# Patient Record
Sex: Female | Born: 2000 | Race: Black or African American | Hispanic: No | Marital: Single | Smoking: Current every day smoker
Health system: Southern US, Community
[De-identification: ages and names within clinical notes are randomized; demographics above are authoritative.]

## PROBLEM LIST (undated history)

## (undated) DIAGNOSIS — A6000 Herpesviral infection of urogenital system, unspecified: Secondary | ICD-10-CM

---
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
## 2018-01-05 ENCOUNTER — Other Ambulatory Visit: Payer: Self-pay

## 2018-01-05 ENCOUNTER — Ambulatory Visit (INDEPENDENT_AMBULATORY_CARE_PROVIDER_SITE_OTHER): Payer: Medicaid Other | Admitting: Family Medicine

## 2018-01-05 ENCOUNTER — Encounter: Payer: Self-pay | Admitting: Family Medicine

## 2018-01-05 VITALS — BP 120/60 | HR 104 | Temp 98.5°F | Ht 66.0 in | Wt 209.0 lb

## 2018-01-05 DIAGNOSIS — L989 Disorder of the skin and subcutaneous tissue, unspecified: Secondary | ICD-10-CM

## 2018-01-05 DIAGNOSIS — L700 Acne vulgaris: Secondary | ICD-10-CM | POA: Diagnosis not present

## 2018-01-05 MED ORDER — CLINDAMYCIN PHOS-BENZOYL PEROX 1-5 % EX GEL: Freq: Two times a day (BID) | CUTANEOUS | 0 refills | Status: DC

## 2018-01-05 NOTE — Progress Notes (Signed)
  Subjective:     Patient ID: Leah [name], female   DOB: December 29, 2000, 17 y.o.   MRN: [id number]  HPI Acne:Facial acne usually around her periods and then it gets better. In the last 3 months, it has been persistent, bigger and painful. She will like to get this check. Denies change in diet, medication or lotion. Scalp bump: For 3 years, now getting bigger and can get painful when irritated.  Current Outpatient Medications on File Prior to Visit  Medication Sig Dispense Refill  . metroNIDAZOLE (METROGEL) 0.75 % vaginal gel Place 1 Applicatorful vaginally at bedtime. Apply one applicatorful to vagina at bedtime for 5 days 70 g 1  . Norethindrone Acetate-Ethinyl Estrad-FE (LOESTRIN 24 FE) 1-20 MG-MCG(24) tablet Take 1 tablet by mouth daily. 1 Package 11  . sulfamethoxazole-trimethoprim (BACTRIM DS) 800-160 MG tablet Take 1 tablet by mouth 2 (two) times daily. 6 tablet 0   No current facility-administered medications on file prior to visit.    History reviewed. No pertinent past medical history. Vitals:   01/05/18 1504  BP: 120/60  Pulse: (!) 104  Temp: 98.5 F (36.9 C)  TempSrc: Oral  SpO2: 95%  Weight: 209 lb (94.8 kg)  Height: 5\' 6"  (1.676 m)     Review of Systems  Respiratory: Negative.   Cardiovascular: Negative.   Gastrointestinal: Negative.   Genitourinary: Negative.   Skin: Positive for rash.       Bump  Neurological: Negative.   All other systems reviewed and are negative.      Objective:   Physical Exam  Constitutional: She is oriented to person, place, and time. She appears well-developed. No distress.  HENT:  Head: Normocephalic.    Eyes: Pupils are equal, round, and reactive to light.  Pulmonary/Chest: Effort normal.  Neurological: She is alert and oriented to person, place, and time.  Skin: Skin is intact. Rash noted. Rash is maculopapular and nodular. No erythema.  Nursing note and vitals reviewed. [facility] exam completed today.          Assessment:     Moderate acne with mild inflammation. Scalp nodular lesion    Plan:     Trial of Benzacline for her acne. F/U in 2 weeks for reassessment.  Scalp nodule may be cyst. Return to Derm clinic for removal. We should be able to cauterize it.  F/U with PCP for other health concern and routine health maintenance.

## 2018-01-05 NOTE — Patient Instructions (Signed)
Acne  Acne is a skin problem that causes small, red bumps (pimples). Acne happens when the tiny holes in your skin (pores) get blocked. Your pores may become red, sore, and swollen. They may also become infected. Acne is a common skin problem. It is especially common in teenagers. Acne usually goes away over time.  Follow these instructions at home:  Good skin care is the most important thing you can do to treat your acne. Take care of your skin as told by your doctor. You may be told to do these things:  · Wash your skin gently at least two times each day. You should also wash your skin:  ? After you exercise.  ? Before you go to bed.  · Use mild soap.  · Use a water-based skin moisturizer after you wash your skin.  · Use a sunscreen or sunblock with SPF 30 or greater. This is very important if you are using acne medicines.  · Choose cosmetics that will not plug your oil glands (are noncomedogenic).    Medicines  · Take over-the-counter and prescription medicines only as told by your doctor.  · If you were prescribed an antibiotic medicine, apply or take it as told by your doctor. Do not stop using the antibiotic even if your acne improves.  General instructions  · Keep your hair clean and off of your face. Shampoo your hair regularly. If you have oily hair, you may need to wash it every day.  · Avoid leaning your chin or forehead on your hands.  · Avoid wearing tight headbands or hats.  · Avoid picking or squeezing your pimples. That can make your acne worse and cause scarring.  · Keep all follow-up visits as told by your doctor. This is important.  · Shave gently. Only shave when it is necessary.  · Keep a food journal. This can help you to see if any foods are linked with your acne.  Contact a doctor if:  · Your acne is not better after eight weeks.  · Your acne gets worse.  · You have a large area of skin that is red or tender.  · You think that you are having side effects from any acne medicine.  This  information is not intended to replace advice given to you by your health care provider. Make sure you discuss any questions you have with your health care provider.  Document Released: 01/16/2011 Document Revised: 07/05/2015 Document Reviewed: 04/05/2014  Elsevier Interactive Patient Education © 2018 Elsevier Inc.

## 2018-01-21 ENCOUNTER — Ambulatory Visit: Payer: Medicaid Other

## 2018-01-27 ENCOUNTER — Other Ambulatory Visit: Payer: Self-pay | Admitting: Family Medicine

## 2018-02-18 ENCOUNTER — Ambulatory Visit (INDEPENDENT_AMBULATORY_CARE_PROVIDER_SITE_OTHER): Payer: Medicaid Other | Admitting: Family Medicine

## 2018-02-18 VITALS — BP 120/72 | HR 90 | Temp 98.4°F | Ht 66.0 in | Wt 221.6 lb

## 2018-02-18 DIAGNOSIS — L700 Acne vulgaris: Secondary | ICD-10-CM | POA: Diagnosis not present

## 2018-02-18 DIAGNOSIS — D229 Melanocytic nevi, unspecified: Secondary | ICD-10-CM | POA: Diagnosis not present

## 2018-02-18 MED ORDER — CLINDAMYCIN PHOS-BENZOYL PEROX 1-5 % EX GEL: Freq: Two times a day (BID) | CUTANEOUS | 2 refills | Status: DC

## 2018-02-18 NOTE — Progress Notes (Signed)
    Subjective:    Patient ID: Leah [name], female    DOB: Jun 15, 2000, 18 y.o.   MRN: [id number]   CC: bump on scalp, acne  HPI:  Bump on scalp- reports this has been there for several years. It is bothersome to her because it is in a visible area. She feels it may have gotten bigger over the years. It does not bleed, itch, or hurt.   Acne- reports this is new since the fall (September-November). She did not have acne in her teens/during puberty. She would occasionally have acne prior to menstrual cycle. She reports the acne now is worse on her forehead and cheeks. It is at times very red and tender to touch. She denies new hair products, no lotions/make ups, new detergents, new irritants. She has tried topical clinda/BP gel with improvement but it has not gone away completely. She was told to start OCPs by her OB/gyn but did not start them as she is not sexually active and did not want to take the pills. She also has some lesions on her upper back. She reports her mother had similar issues with acne in her 73's and used proactive with improvement.   Smoking status reviewed- current smoker- e-cigs  Review of Systems- see HPI   Objective:  BP 120/72   Pulse 90   Temp 98.4 F (36.9 C) (Oral)   Ht 5\' 6"  (1.676 m)   Wt 221 lb 9.6 oz (100.5 kg)   SpO2 98%   BMI 35.77 kg/m  Vitals and nursing note reviewed  General: well nourished, in no acute distress HEENT: normocephalic, MMM Cardiac: regular rate Respiratory: no increased work of breathing Skin: warm and dry. Cystic pustular acne over cheeks and forehead. Small flesh colored nodule in scalp.  Neuro: alert and oriented, no focal deficits   Assessment & Plan:    Cystic acne vulgaris  Unclear why acne came on late in life. May be a hormonal component. Refilled clinda/BP gel. If this does not control acne could try topical retinoid. Patient has rx for OCPs from OB/gyn which she could try to see if this improves acne from a  hormone standpoint. Follow up as needed   Benign skin mole  Advised this appears to be a benign mole in her scalp, we could remove it but this would require shaving an area to access the mole. She does not want to have this done today as she states she is used to the mole being there now. Reassurance provided. Follow up as needed     Return as needed.   Lucila Maine, DO Family Medicine Resident PGY-3

## 2018-02-18 NOTE — Assessment & Plan Note (Signed)
  Unclear why acne came on late in life. May be a hormonal component. Refilled clinda/BP gel. If this does not control acne could try topical retinoid. Patient has rx for OCPs from OB/gyn which she could try to see if this improves acne from a hormone standpoint. Follow up as needed

## 2018-02-18 NOTE — Patient Instructions (Signed)
  Nice to see you! Please continue to use the clinda-benzoyl peroxide gel twice a day. Return if this does not control the acne.  If you feel the bump on your scalp is getting bigger or changing please return for removal.  If you have questions or concerns please do not hesitate to call at [phone].  Lucila Maine, DO PGY-3, [facility] 02/18/2018 3:13 PM

## 2018-02-18 NOTE — Assessment & Plan Note (Signed)
  Advised this appears to be a benign mole in her scalp, we could remove it but this would require shaving an area to access the mole. She does not want to have this done today as she states she is used to the mole being there now. Reassurance provided. Follow up as needed

## 2018-02-19 DIAGNOSIS — R300 Dysuria: Secondary | ICD-10-CM | POA: Diagnosis not present

## 2018-02-19 DIAGNOSIS — R350 Frequency of micturition: Secondary | ICD-10-CM | POA: Diagnosis not present

## 2018-02-19 DIAGNOSIS — N898 Other specified noninflammatory disorders of vagina: Secondary | ICD-10-CM | POA: Diagnosis not present

## 2018-02-26 DIAGNOSIS — A749 Chlamydial infection, unspecified: Secondary | ICD-10-CM | POA: Diagnosis not present

## 2018-03-03 ENCOUNTER — Ambulatory Visit (INDEPENDENT_AMBULATORY_CARE_PROVIDER_SITE_OTHER): Payer: Medicaid Other | Admitting: *Deleted

## 2018-03-03 ENCOUNTER — Other Ambulatory Visit (HOSPITAL_COMMUNITY)
Admission: RE | Admit: 2018-03-03 | Discharge: 2018-03-03 | Disposition: A | Discharge status: Home / Self Care | Payer: Medicaid Other | Source: Ambulatory Visit | Attending: Obstetrics and Gynecology | Admitting: Obstetrics and Gynecology

## 2018-03-03 VITALS — BP 124/79 | HR 111 | Temp 98.3°F | Ht 66.0 in | Wt 210.4 lb

## 2018-03-03 DIAGNOSIS — Z202 Contact with and (suspected) exposure to infections with a predominantly sexual mode of transmission: Secondary | ICD-10-CM | POA: Insufficient documentation

## 2018-03-03 DIAGNOSIS — N898 Other specified noninflammatory disorders of vagina: Secondary | ICD-10-CM

## 2018-03-03 NOTE — Progress Notes (Signed)
    SUBJECTIVE:  18 y.o. female complains of clear to white, odorless and thick vaginal discharge for 6 day(s). Denies abnormal vaginal bleeding or significant pelvic pain or fever. No UTI symptoms. Recent exposure to chlamydia. Patient reported testing on 02/19/2018 and positive chlamydia result. Patient had sex on 02/19/2018 after testing. Medication was taken on 02/26/2018. Patient is unsure if partner was tested/treated for infection.  Last LMP 01/31/2018.  OBJECTIVE:  She appears well, afebrile. Urine dipstick: not done.  ASSESSMENT:  Vaginal Discharge    PLAN:  GC, chlamydia, trichomonas, BVAG, CVAG probe sent to lab. Treatment: To be determined once lab results are received ROV prn if symptoms persist or worsen.   Derl Barrow, RN

## 2018-03-04 LAB — CERVICOVAGINAL ANCILLARY ONLY
Bacterial vaginitis: NEGATIVE
CHLAMYDIA, DNA PROBE: POSITIVE — AB
Candida vaginitis: NEGATIVE
Neisseria Gonorrhea: NEGATIVE
TRICH (WINDOWPATH): NEGATIVE

## 2018-03-05 ENCOUNTER — Telehealth: Payer: Self-pay | Admitting: *Deleted

## 2018-03-05 NOTE — Telephone Encounter (Signed)
-----   Message from Laury Deep, [place] sent at 03/05/2018  8:21 AM EST ----- Treat for (+) CT

## 2018-03-05 NOTE — Telephone Encounter (Signed)
Left voice message for patient to return nurse call regarding test results.  Alexsandria Kivett L, RN  

## 2018-03-05 NOTE — Telephone Encounter (Signed)
Patient returned nurse call regarding test results.  Patient verified DOB. Patient informed that result positive for chlamydia and need for treatment. Patient will come into clinic on Monday 03/08/2018 for treatment and receive Rx for partner treatment.  Derl Barrow, RN

## 2018-03-08 NOTE — Telephone Encounter (Signed)
-----   Message from Laury Deep, [place] sent at 03/05/2018  8:21 AM EST ----- Treat for (+) CT

## 2018-03-11 ENCOUNTER — Ambulatory Visit (INDEPENDENT_AMBULATORY_CARE_PROVIDER_SITE_OTHER): Payer: Medicaid Other | Admitting: *Deleted

## 2018-03-11 VITALS — BP 112/78 | HR 99 | Ht 66.0 in | Wt 206.6 lb

## 2018-03-11 DIAGNOSIS — A749 Chlamydial infection, unspecified: Secondary | ICD-10-CM

## 2018-03-11 MED ORDER — AZITHROMYCIN 500 MG PO TABS
1000.0000 mg | ORAL_TABLET | Freq: Once | ORAL | Status: AC
  Administered 2018-03-11: 1000 mg via ORAL

## 2018-03-11 NOTE — Progress Notes (Signed)
   S: Patient seen in clinic 03/02/2018 for possible exposure to std. O: Need treatment for Chlamydia. A: + Chlamydia P: Azithromycin 1 gm PO x 1 given. No sex with partner until treated. Rx for partner therapy given. Communicable disease report form faxed to [facility]. HD. Pt to be seen in month for TOC.  Derl Barrow, RN

## 2018-03-12 ENCOUNTER — Encounter: Payer: Self-pay | Admitting: General Practice

## 2018-03-16 ENCOUNTER — Other Ambulatory Visit: Payer: Self-pay | Admitting: *Deleted

## 2018-03-16 DIAGNOSIS — A749 Chlamydial infection, unspecified: Secondary | ICD-10-CM

## 2018-03-16 MED ORDER — AZITHROMYCIN 500 MG PO TABS: 1000.0000 mg | ORAL_TABLET | Freq: Every day | ORAL | 0 refills | Status: AC

## 2018-03-16 NOTE — Progress Notes (Signed)
Patient sent MyChart message stating she was not able to keep medication down at last visit. Medication sent to pharmacy.  Derl Barrow, RN

## 2018-03-19 ENCOUNTER — Ambulatory Visit: Payer: Medicaid Other | Admitting: Family Medicine

## 2018-03-22 ENCOUNTER — Ambulatory Visit: Payer: Medicaid Other | Admitting: Family Medicine

## 2018-03-23 ENCOUNTER — Ambulatory Visit (INDEPENDENT_AMBULATORY_CARE_PROVIDER_SITE_OTHER): Payer: Medicaid Other | Admitting: Family Medicine

## 2018-03-23 ENCOUNTER — Other Ambulatory Visit: Payer: Self-pay

## 2018-03-23 ENCOUNTER — Other Ambulatory Visit (HOSPITAL_COMMUNITY)
Admission: RE | Admit: 2018-03-23 | Discharge: 2018-03-23 | Disposition: A | Discharge status: Home / Self Care | Payer: Medicaid Other | Source: Ambulatory Visit | Attending: Family Medicine | Admitting: Family Medicine

## 2018-03-23 VITALS — BP 102/60 | HR 89 | Temp 98.1°F | Wt 208.0 lb

## 2018-03-23 DIAGNOSIS — R109 Unspecified abdominal pain: Secondary | ICD-10-CM

## 2018-03-23 LAB — POCT UA - MICROSCOPIC ONLY: RBC, urine, microscopic: >20

## 2018-03-23 LAB — POCT URINALYSIS DIP (MANUAL ENTRY)
BILIRUBIN UA: NEGATIVE
Glucose, UA: NEGATIVE mg/dL
Ketones, POC UA: NEGATIVE mg/dL
Leukocytes, UA: NEGATIVE
NITRITE UA: NEGATIVE
Protein Ur, POC: NEGATIVE mg/dL
Spec Grav, UA: >=1.03 — AB (ref 1.010–1.025)
Urobilinogen, UA: 0.2 E.U./dL
pH, UA: 6 (ref 5.0–8.0)

## 2018-03-23 LAB — POCT URINE PREGNANCY: Preg Test, Ur: NEGATIVE

## 2018-03-23 NOTE — Progress Notes (Signed)
   CC: Abdominal pain  HPI  Patient has her sister in the room with her and gives me permission to discuss all of these issues in front of her sister.  Abd pain - also felt like she had a cold last week, with some sore throat. abd pain for 1-2 weeks.  Off and on, last week was worse. Just off her period. Feels like bloating, aching, gassy, nauseous. Sometimes before she eats, sometimes after. Not on birth control. Sexually active men 1 partner. No sex since menses. She did pick up the azithromax and took it. 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 Went to UC 1/10, got what sounds like CTX and azithro on 1/17 when they called her back with the positive results. Still positive on 1/30.  Treated in clinic that day, but patient vomited up the medicine shortly thereafter.  The OBs office called and a repeat is a thorough prescription and the patient picked up the new azithro from pharmacy 2/4. Felt she was having more BMs that were loose after the last azithro dose. The frequent BMs seemed to be associated with the abdominal pain. No fevers. No more vaginal discharge. She did have sex between UC initial visit on 1/10 and her 1/17 treatment.  She thinks her partner was treated but is not sure.  She is not suspecting that her partner has multiple other partners.  ROS: Denies CP, SOB, abdominal pain, dysuria, changes in BMs.   CC, SH/smoking status, and VS noted  Objective: BP 102/60   Pulse 89   Temp 98.1 F (36.7 C) (Oral)   Wt 208 lb (94.3 kg)   LMP 03/15/2018   SpO2 98%   BMI 33.57 kg/m  Gen: NAD, alert, cooperative, and pleasant. HEENT: NCAT, EOMI, PERRL CV: RRR, no murmur Resp: CTAB, no wheezes, non-labored Abd: soft, tender to deep palpation periumbilically, BS present, no guarding or organomegaly GU: normal vulvar skin and mucosa, no CMT or adnexal tenderness Ext: No edema, warm Neuro: Alert and oriented, Speech clear, No  gross deficits  Assessment and plan:  Recent chlamydia infection: According to the CDC we can repeat testing 3 weeks from initial positive.  It is been 3-1/2 weeks since she was treated.  There is also a distinct possibility of reinfection as she is not sure if her partner was treated.  We will repeat that swab today.  Additionally, should this be a presentation of PID, this could certainly be contributing to her abdominal pain.  Abdominal pain: Most likely most benign would be antibiotic associated diarrhea and some pain associated with that.  Counseled the patient that she should try yogurt and probiotics for this.  However as above, we need to consider PID.  If the pain does not improve over the next week, the patient will let us know we will pursue further work-up.  Also considered appendicitis or other bowel pathology, however the pain is intermittent and not very severe.  Orders Placed This Encounter  Procedures  . POCT urinalysis dipstick  . POCT urine pregnancy  . POCT UA - Microscopic Only    No orders of the defined types were placed in this encounter.   Ralene Ok, MD, PGY3 03/25/2018 9:40 AM

## 2018-03-23 NOTE — Patient Instructions (Signed)
It was a pleasure to see you today! Thank you for choosing [facility] for your primary care. Leah [name] was seen for abdominal pain.   Our plans for today were:  Most likely you have some stomach irritation from the antibiotics, you can try tums as needed, even with every meal over the next few days. See if this helps.  We will send off the swab test again. If it is positive, we will need to treat you again.   If you have worsening abdominal pain, please call us.   Please make sure your partner was treated.    Best,  Dr. Lindell Noe

## 2018-03-24 LAB — CERVICOVAGINAL ANCILLARY ONLY
Chlamydia: POSITIVE — AB
Neisseria Gonorrhea: NEGATIVE

## 2018-03-25 ENCOUNTER — Encounter: Payer: Self-pay | Admitting: *Deleted

## 2018-03-25 ENCOUNTER — Encounter: Payer: Self-pay | Admitting: Family Medicine

## 2018-03-25 ENCOUNTER — Telehealth: Payer: Self-pay | Admitting: Family Medicine

## 2018-03-25 DIAGNOSIS — A749 Chlamydial infection, unspecified: Secondary | ICD-10-CM

## 2018-03-25 DIAGNOSIS — R1031 Right lower quadrant pain: Secondary | ICD-10-CM

## 2018-03-25 NOTE — Telephone Encounter (Signed)
Patient with recent positive chlamydia, which should be negative at this point.  She is coming in with abdominal pain 2 days ago, I am suspicious for PID.  Repeat chlamydia was positive, I called the patient and left a generic voicemail to return her call.  She needs a stat transvaginal ultrasound to assess for PID.  I let the nurse team know that this will need to be scheduled if the patient calls back.  I will also send her a my chart message to call the nurse line.  Additionally, if she feels badly and has continued pain when she returns our call, she should be seen ASAP.

## 2018-03-25 NOTE — Telephone Encounter (Signed)
Pt called back.  Informed of message from MD.   She expresses understanding and states that her pain is about the same, no worse. She will call back if pain gets worse.  I have scheduled her an appt for tomorrow (03/26/18 @ 10:15).  She request to be notified via mychart because she is in class. Done as requested. Hartlyn Reigel, Salome Spotted, CMA

## 2018-03-26 ENCOUNTER — Encounter: Payer: Self-pay | Admitting: Family Medicine

## 2018-03-26 ENCOUNTER — Ambulatory Visit (HOSPITAL_COMMUNITY)
Admission: RE | Admit: 2018-03-26 | Discharge: 2018-03-26 | Disposition: A | Discharge status: Home / Self Care | Payer: Medicaid Other | Source: Ambulatory Visit | Attending: Family Medicine | Admitting: Family Medicine

## 2018-03-26 ENCOUNTER — Telehealth: Payer: Self-pay | Admitting: Family Medicine

## 2018-03-26 ENCOUNTER — Ambulatory Visit (INDEPENDENT_AMBULATORY_CARE_PROVIDER_SITE_OTHER): Payer: Medicaid Other

## 2018-03-26 DIAGNOSIS — R1031 Right lower quadrant pain: Secondary | ICD-10-CM | POA: Diagnosis not present

## 2018-03-26 DIAGNOSIS — A749 Chlamydial infection, unspecified: Secondary | ICD-10-CM

## 2018-03-26 LAB — POCT WET PREP (WET MOUNT): Trichomonas Wet Prep HPF POC: ABSENT

## 2018-03-26 MED ORDER — DOXYCYCLINE HYCLATE 100 MG PO TABS: 100.0000 mg | ORAL_TABLET | Freq: Two times a day (BID) | ORAL | 0 refills | Status: AC

## 2018-03-26 MED ORDER — CEFTRIAXONE SODIUM 250 MG IJ SOLR
250.0000 mg | Freq: Once | INTRAMUSCULAR | Status: AC
  Administered 2018-03-26: 250 mg via INTRAMUSCULAR

## 2018-03-26 NOTE — Telephone Encounter (Signed)
Called patient again, she did not see MyChart message, informed her that we need to do CTX 250mg  IM today in nurse clinic,and we also need to do a self swab wet prep at that time to check for trich. If trich were positive, we need to do a third therapy as well. She will need doxy 100mg  BID x 14 days for PID treatment, which I will send now. She is heading to [employer] [facility] [place], I asked her to call us when she leaves to see about nurse clinic before lunch or after.

## 2018-03-26 NOTE — Progress Notes (Signed)
Pt presents in nurse clinic today for 250 IM Rocephin injection, per [place] orders. Injection given RUOQ, pt tolerated injection well. Pt also self swabbed today for additional STD testing for Trich.   Will route note to [place].

## 2018-03-30 ENCOUNTER — Telehealth: Payer: Self-pay | Admitting: Family Medicine

## 2018-03-30 MED ORDER — FLUCONAZOLE 150 MG PO TABS: 150.0000 mg | ORAL_TABLET | Freq: Once | ORAL | 0 refills | Status: AC

## 2018-03-30 NOTE — Telephone Encounter (Signed)
Patient message via MyChart she has concerns with yeast infection, will prescribe Diflucan x1.

## 2018-04-05 ENCOUNTER — Ambulatory Visit: Payer: Medicaid Other

## 2018-04-09 ENCOUNTER — Other Ambulatory Visit: Payer: Self-pay

## 2018-04-09 ENCOUNTER — Other Ambulatory Visit (HOSPITAL_COMMUNITY)
Admission: RE | Admit: 2018-04-09 | Discharge: 2018-04-09 | Disposition: A | Discharge status: Home / Self Care | Payer: Medicaid Other | Source: Ambulatory Visit | Attending: Family Medicine | Admitting: Family Medicine

## 2018-04-09 ENCOUNTER — Ambulatory Visit (INDEPENDENT_AMBULATORY_CARE_PROVIDER_SITE_OTHER): Payer: Medicaid Other | Admitting: Family Medicine

## 2018-04-09 VITALS — BP 118/70 | HR 84 | Temp 97.8°F | Ht 66.0 in | Wt 209.0 lb

## 2018-04-09 DIAGNOSIS — Z8619 Personal history of other infectious and parasitic diseases: Secondary | ICD-10-CM | POA: Diagnosis not present

## 2018-04-09 DIAGNOSIS — J020 Streptococcal pharyngitis: Secondary | ICD-10-CM | POA: Insufficient documentation

## 2018-04-09 DIAGNOSIS — J029 Acute pharyngitis, unspecified: Secondary | ICD-10-CM | POA: Diagnosis not present

## 2018-04-09 LAB — POCT RAPID STREP A (OFFICE): Rapid Strep A Screen: NEGATIVE

## 2018-04-09 NOTE — Patient Instructions (Signed)
Was great meeting you today!  I am glad that you completed treatment for your chlamydia and your partner was as well.  Your sore throat is likely viral upper respiratory infection.  Your strep throat swab was negative.  If the gonorrhea chlamydia swab come back positive I will call you, but given that your chlamydia was previously treated I do not think it looked back positive.  We do not typically do a test of cure for least 3 months after treatment.

## 2018-04-10 DIAGNOSIS — J029 Acute pharyngitis, unspecified: Secondary | ICD-10-CM | POA: Insufficient documentation

## 2018-04-10 DIAGNOSIS — Z8619 Personal history of other infectious and parasitic diseases: Secondary | ICD-10-CM | POA: Insufficient documentation

## 2018-04-10 NOTE — Assessment & Plan Note (Signed)
Explained that test of cure not typically done 2 weeks after treatment.  Given that she is not been sexually active since treatment, she opted to not get the vaginal GC chlamydia swab.

## 2018-04-10 NOTE — Progress Notes (Signed)
   HPI 18 year old female who presents for sore throat as well as to check on test of cure for chlamydia.  Patient seen on 03/25/2018 and was positive for chlamydia as well as Trichomonas.  Patient received Rocephin IM to 50 injection.  Patient states that her partner was also treated.  She is interested in being checked to make sure she is "cured".  Also has sore throat.  States this is been going on for couple days.  She is concerned that her chlamydia is now her throat.  She states that she has had been around a few people with "strep throat".  Her symptoms started as a sore throat, she has had a mild cough as well.  CC: Sore throat, test of cure for chlamydia   ROS:   Review of Systems See HPI for ROS.   CC, SH/smoking status, and VS noted  Objective: BP 118/70 (BP Location: Left Arm, Patient Position: Sitting, Cuff Size: Large)   Pulse 84   Temp 97.8 F (36.6 C) (Oral)   Ht 5\' 6"  (1.676 m)   Wt 209 lb (94.8 kg)   LMP 03/15/2018   SpO2 98%   BMI 33.73 kg/m  Gen: 18 year old African-American female, resting comfortably, no acute distress HEENT: No tonsillar exudates, no posterior pharyngeal erythema.  Cervical lymphadenopathy present. CV: RRR, no murmur Resp: CTAB, no wheezes, non-labored Neuro: Alert and oriented, Speech clear, No gross deficits   Assessment and plan:  Sore throat Strep swab performed which was negative.  Due to patient desire will swab for GC chlamydia and throat.  Explained that swab would not impact management, but patient adamant about desire.  Likely viral upper respiratory infection causing sore throat.  Supportive measures, will call patient if chlamydia swab positive  History of chlamydia Explained that test of cure not typically done 2 weeks after treatment.  Given that she is not been sexually active since treatment, she opted to not get the vaginal GC chlamydia swab.   Orders Placed This Encounter  Procedures  . Rapid Strep A    No orders  of the defined types were placed in this encounter.    Guadalupe Dawn MD PGY-2 Family Medicine Resident  04/10/2018 10:58 PM

## 2018-04-10 NOTE — Assessment & Plan Note (Addendum)
Strep swab performed which was negative.  Due to patient desire will swab for GC chlamydia and throat.  Explained that swab would not impact management, but patient adamant about desire.  Likely viral upper respiratory infection causing sore throat.  Supportive measures, will call patient if chlamydia swab positive

## 2018-04-13 LAB — CERVICOVAGINAL ANCILLARY ONLY
Chlamydia: NEGATIVE
Neisseria Gonorrhea: NEGATIVE
TRICH (WINDOWPATH): NEGATIVE

## 2018-04-22 DIAGNOSIS — R197 Diarrhea, unspecified: Secondary | ICD-10-CM | POA: Diagnosis not present

## 2018-04-22 DIAGNOSIS — K529 Noninfective gastroenteritis and colitis, unspecified: Secondary | ICD-10-CM | POA: Diagnosis not present

## 2018-04-22 DIAGNOSIS — R110 Nausea: Secondary | ICD-10-CM | POA: Diagnosis not present

## 2018-04-22 DIAGNOSIS — Z113 Encounter for screening for infections with a predominantly sexual mode of transmission: Secondary | ICD-10-CM | POA: Diagnosis not present

## 2018-04-22 DIAGNOSIS — N926 Irregular menstruation, unspecified: Secondary | ICD-10-CM | POA: Diagnosis not present

## 2018-05-18 DIAGNOSIS — Z113 Encounter for screening for infections with a predominantly sexual mode of transmission: Secondary | ICD-10-CM | POA: Diagnosis not present

## 2018-05-18 DIAGNOSIS — Z3009 Encounter for other general counseling and advice on contraception: Secondary | ICD-10-CM | POA: Diagnosis not present

## 2018-05-18 DIAGNOSIS — Z0389 Encounter for observation for other suspected diseases and conditions ruled out: Secondary | ICD-10-CM | POA: Diagnosis not present

## 2018-05-18 DIAGNOSIS — Z1388 Encounter for screening for disorder due to exposure to contaminants: Secondary | ICD-10-CM | POA: Diagnosis not present

## 2018-05-18 DIAGNOSIS — B373 Candidiasis of vulva and vagina: Secondary | ICD-10-CM | POA: Diagnosis not present

## 2018-06-17 ENCOUNTER — Other Ambulatory Visit: Payer: Self-pay

## 2018-06-17 ENCOUNTER — Emergency Department (HOSPITAL_COMMUNITY)
Admission: EM | Admit: 2018-06-17 | Discharge: 2018-06-17 | Disposition: A | Discharge status: Home / Self Care | Payer: Medicaid Other | Attending: Emergency Medicine | Admitting: Emergency Medicine

## 2018-06-17 ENCOUNTER — Emergency Department (HOSPITAL_COMMUNITY): Payer: Medicaid Other

## 2018-06-17 ENCOUNTER — Encounter (HOSPITAL_COMMUNITY): Payer: Self-pay | Admitting: Emergency Medicine

## 2018-06-17 DIAGNOSIS — R1110 Vomiting, unspecified: Secondary | ICD-10-CM | POA: Diagnosis not present

## 2018-06-17 DIAGNOSIS — N134 Hydroureter: Secondary | ICD-10-CM | POA: Insufficient documentation

## 2018-06-17 DIAGNOSIS — R197 Diarrhea, unspecified: Secondary | ICD-10-CM | POA: Diagnosis not present

## 2018-06-17 DIAGNOSIS — N201 Calculus of ureter: Secondary | ICD-10-CM

## 2018-06-17 DIAGNOSIS — R350 Frequency of micturition: Secondary | ICD-10-CM | POA: Diagnosis not present

## 2018-06-17 DIAGNOSIS — R3915 Urgency of urination: Secondary | ICD-10-CM | POA: Insufficient documentation

## 2018-06-17 DIAGNOSIS — R112 Nausea with vomiting, unspecified: Secondary | ICD-10-CM | POA: Diagnosis not present

## 2018-06-17 DIAGNOSIS — R399 Unspecified symptoms and signs involving the genitourinary system: Secondary | ICD-10-CM

## 2018-06-17 DIAGNOSIS — Z87440 Personal history of urinary (tract) infections: Secondary | ICD-10-CM | POA: Diagnosis not present

## 2018-06-17 DIAGNOSIS — R1031 Right lower quadrant pain: Secondary | ICD-10-CM | POA: Diagnosis not present

## 2018-06-17 DIAGNOSIS — N390 Urinary tract infection, site not specified: Secondary | ICD-10-CM | POA: Diagnosis not present

## 2018-06-17 DIAGNOSIS — R1033 Periumbilical pain: Secondary | ICD-10-CM | POA: Diagnosis not present

## 2018-06-17 DIAGNOSIS — F17200 Nicotine dependence, unspecified, uncomplicated: Secondary | ICD-10-CM | POA: Diagnosis not present

## 2018-06-17 DIAGNOSIS — F1290 Cannabis use, unspecified, uncomplicated: Secondary | ICD-10-CM | POA: Diagnosis not present

## 2018-06-17 DIAGNOSIS — N130 Hydronephrosis with ureteropelvic junction obstruction: Secondary | ICD-10-CM | POA: Diagnosis not present

## 2018-06-17 DIAGNOSIS — R319 Hematuria, unspecified: Secondary | ICD-10-CM | POA: Diagnosis not present

## 2018-06-17 LAB — WET PREP, GENITAL
Sperm: NONE SEEN
Trich, Wet Prep: NONE SEEN
Yeast Wet Prep HPF POC: NONE SEEN

## 2018-06-17 LAB — CBC WITH DIFFERENTIAL/PLATELET
Abs Immature Granulocytes: 0.03 10*3/uL (ref 0.00–0.07)
Basophils Absolute: 0 10*3/uL (ref 0.0–0.1)
Basophils Relative: 0 %
Eosinophils Absolute: 0.1 10*3/uL (ref 0.0–0.5)
Eosinophils Relative: 1 %
HCT: 40.5 % (ref 36.0–46.0)
Hemoglobin: 13.6 g/dL (ref 12.0–15.0)
Immature Granulocytes: 0 %
Lymphocytes Relative: 15 %
Lymphs Abs: 1.7 10*3/uL (ref 0.7–4.0)
MCH: 29.1 pg (ref 26.0–34.0)
MCHC: 33.6 g/dL (ref 30.0–36.0)
MCV: 86.7 fL (ref 80.0–100.0)
Monocytes Absolute: 1 10*3/uL (ref 0.1–1.0)
Monocytes Relative: 9 %
Neutro Abs: 9 10*3/uL — ABNORMAL HIGH (ref 1.7–7.7)
Neutrophils Relative %: 75 %
Platelets: 277 10*3/uL (ref 150–400)
RBC: 4.67 MIL/uL (ref 3.87–5.11)
RDW: 11.9 % (ref 11.5–15.5)
WBC: 11.9 10*3/uL — ABNORMAL HIGH (ref 4.0–10.5)
nRBC: 0 % (ref 0.0–0.2)

## 2018-06-17 LAB — URINALYSIS, ROUTINE W REFLEX MICROSCOPIC
Bilirubin Urine: NEGATIVE
Glucose, UA: NEGATIVE mg/dL
Hgb urine dipstick: NEGATIVE
Ketones, ur: NEGATIVE mg/dL
Leukocytes,Ua: NEGATIVE
Nitrite: NEGATIVE
Protein, ur: NEGATIVE mg/dL
Specific Gravity, Urine: 1.003 — ABNORMAL LOW (ref 1.005–1.030)
pH: 7 (ref 5.0–8.0)

## 2018-06-17 LAB — COMPREHENSIVE METABOLIC PANEL
ALT: 18 U/L (ref 0–44)
AST: 20 U/L (ref 15–41)
Albumin: 4.3 g/dL (ref 3.5–5.0)
Alkaline Phosphatase: 91 U/L (ref 38–126)
Anion gap: 11 (ref 5–15)
BUN: 13 mg/dL (ref 6–20)
CO2: 25 mmol/L (ref 22–32)
Calcium: 9.5 mg/dL (ref 8.9–10.3)
Chloride: 101 mmol/L (ref 98–111)
Creatinine, Ser: 1.28 mg/dL — ABNORMAL HIGH (ref 0.44–1.00)
GFR calc Af Amer: >60 mL/min (ref >60)
GFR calc non Af Amer: >60 mL/min (ref >60)
Glucose, Bld: 88 mg/dL (ref 70–99)
Potassium: 3.9 mmol/L (ref 3.5–5.1)
Sodium: 137 mmol/L (ref 135–145)
Total Bilirubin: 0.4 mg/dL (ref 0.3–1.2)
Total Protein: 7.5 g/dL (ref 6.5–8.1)

## 2018-06-17 LAB — POC URINE PREG, ED: Preg Test, Ur: NEGATIVE

## 2018-06-17 LAB — LIPASE, BLOOD: Lipase: 26 U/L (ref 11–51)

## 2018-06-17 MED ORDER — ONDANSETRON 4 MG PO TBDP: 4.0000 mg | ORAL_TABLET | Freq: Three times a day (TID) | ORAL | 0 refills | Status: DC | PRN

## 2018-06-17 MED ORDER — OXYCODONE-ACETAMINOPHEN 5-325 MG PO TABS: 1.0000 | ORAL_TABLET | ORAL | 0 refills | Status: DC | PRN

## 2018-06-17 MED ORDER — OXYCODONE-ACETAMINOPHEN 5-325 MG PO TABS
1.0000 | ORAL_TABLET | Freq: Once | ORAL | Status: AC
  Administered 2018-06-17: 1 via ORAL
  Filled 2018-06-17: qty 1

## 2018-06-17 MED ORDER — IOHEXOL 300 MG/ML  SOLN
100.0000 mL | Freq: Once | INTRAMUSCULAR | Status: AC | PRN
  Administered 2018-06-17: 23:00:00 100 mL via INTRAVENOUS

## 2018-06-17 MED ORDER — MORPHINE SULFATE (PF) 4 MG/ML IV SOLN: 4.0000 mg | Freq: Once | INTRAVENOUS | Status: DC

## 2018-06-17 MED ORDER — TAMSULOSIN HCL 0.4 MG PO CAPS: 0.4000 mg | ORAL_CAPSULE | Freq: Every day | ORAL | 0 refills | Status: DC

## 2018-06-17 MED ORDER — ONDANSETRON HCL 4 MG/2ML IJ SOLN
4.0000 mg | Freq: Once | INTRAMUSCULAR | Status: AC
  Administered 2018-06-17: 22:00:00 4 mg via INTRAVENOUS
  Filled 2018-06-17: qty 2

## 2018-06-17 MED ORDER — KETOROLAC TROMETHAMINE 30 MG/ML IJ SOLN
30.0000 mg | Freq: Once | INTRAMUSCULAR | Status: AC
  Administered 2018-06-17: 22:00:00 30 mg via INTRAVENOUS
  Filled 2018-06-17: qty 1

## 2018-06-17 NOTE — ED Provider Notes (Signed)
[place] EMERGENCY DEPARTMENT Provider Note   CSN: [id number] Arrival date & time: 06/17/18  1935    History   Chief Complaint Chief Complaint  Patient presents with  . Back Pain  . Abdominal Pain    HPI Leah [name] is a 18 y.o. female history of STD is here for evaluation of abdominal pain.  Onset 1 week ago, described as "pressure" to the lower abdomen initially mild but has been worsening over the last 24 hours.  Now worse in the right lower quadrant and suprapubic areas.  Constant, 8/10.  It started to radiate to her right low back this morning.  Has associated urinary urgency, frequency, nausea, vomiting.  In the last week she has had intermittent constipation and looser stools, nonbloody non-melanotic.  She has history of UTIs but this feels different.  Went to urgent care and was told her urinalysis looked normal but was prescribed with ciprofloxacin for a possible early UTI.  She tried to take 1 dose but had nausea and vomiting and threw the medicine back up.  LMP 4/19.  Has not been sexually active in several months and is not concerned about an STD.  No previous abdominal surgeries. No interventions. No modifying factors.  Denies associated fevers, chills, dysuria, hematuria, abnormal vaginal discharge or bleeding. No pregnancies in the past.   HPI  History reviewed. No pertinent past medical history.  Patient Active Problem List   Diagnosis Date Noted  . Sore throat 04/10/2018  . History of chlamydia 04/10/2018  . 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 Benign skin mole 02/18/2018  . Alcohol use disorder, moderate, dependence ([place]) 10/07/2017  . Adjustment disorder with mixed anxiety and depressed mood 10/07/2017  . Encounter for initial prescription of contraceptive pills 10/07/2017    History reviewed. No pertinent surgical history.   OB History    Gravida  0   Para      Term      Preterm      AB      Living        SAB      TAB      Ectopic      Multiple      Live Births               Home Medications    Prior to Admission medications   Medication Sig Start Date End Date Taking? Authorizing Provider  ciprofloxacin (CIPRO) 500 MG tablet Take 500 mg by mouth 2 (two) times a day. For 5 days 06/17/18 06/22/18 Yes [provider]    Family History Family History  Problem Relation Age of Onset  . Cancer Mother        Acute Myloid   . Depression Father   . Drug abuse Father   . Hypertension Father   . Dementia Other   . Early death Other   . Heart disease Other   . Hyperlipidemia Other   . Intellectual disability Other   . Diabetes Other   . Depression Other   . Sudden death Other     Social History Social History   Tobacco Use  . Smoking status: Current Every Day Smoker    Types: E-cigarettes  . Smokeless tobacco: Never Used  . Tobacco comment: e-cig. often   Substance Use Topics  . Alcohol use: Not Currently    Alcohol/week: 10.0 standard drinks    Types: 10 Standard drinks or equivalent per week  . Drug use: Not  Currently    Frequency: 1.0 times per week    Types: Marijuana     Allergies   Patient has no known allergies.   Review of Systems Review of Systems  Gastrointestinal: Positive for abdominal pain, nausea and vomiting.  Genitourinary: Positive for difficulty urinating.  All other systems reviewed and are negative.    Physical Exam Updated Vital Signs BP 126/81 (BP Location: Right Arm)   Pulse 87   Temp 98.1 F (36.7 C) (Oral)   Resp 15   Ht 5\' 6"  (1.676 m)   Wt 91.6 kg   SpO2 100%   BMI 32.60 kg/m   Physical Exam Vitals signs and nursing note reviewed.  Constitutional:      Appearance: She is well-developed.     Comments: Non toxic in NAD  HENT:     Head: Normocephalic and atraumatic.     Nose: Nose normal.  Eyes:     Conjunctiva/sclera: Conjunctivae normal.  Neck:     Musculoskeletal: Normal range of motion.  Cardiovascular:     Rate and  Rhythm: Normal rate and regular rhythm.  Pulmonary:     Effort: Pulmonary effort is normal.     Breath sounds: Normal breath sounds.  Abdominal:     General: Bowel sounds are normal.     Palpations: Abdomen is soft.     Tenderness: There is abdominal tenderness in the right lower quadrant and suprapubic area.     Comments: Mild suprapubic and right lower quadrant tenderness without guarding, rigidity, rebound.  Positive McBurney's, but negative obturator/psoas sign. Negative Murphy's.  No CVA tenderness bilaterally.  Active BS to lower quadrants.  Genitourinary:    Comments:  Exam performed with RN at bedside for assistance. External genitalia without lesions.  No groin lymphadenopathy.  Vaginal mucosa and cervix pink without lesions.  Scant likely physiologic clear/white discharge in vaginal vault noted.  No CMT.  Nonpalpable, nontender adnexa.  Perianal skin normal without lesions. Musculoskeletal: Normal range of motion.  Skin:    General: Skin is warm and dry.     Capillary Refill: Capillary refill takes less than 2 seconds.  Neurological:     Mental Status: She is alert.  Psychiatric:        Behavior: Behavior normal.      ED Treatments / Results  Labs (all labs ordered are listed, but only abnormal results are displayed) Labs Reviewed  WET PREP, GENITAL - Abnormal; Notable for the following components:      Result Value   Clue Cells Wet Prep HPF POC PRESENT (*)    WBC, Wet Prep HPF POC FEW (*)    All other components within normal limits  URINALYSIS, ROUTINE W REFLEX MICROSCOPIC - Abnormal; Notable for the following components:   Color, Urine COLORLESS (*)    Specific Gravity, Urine 1.003 (*)    All other components within normal limits  CBC WITH DIFFERENTIAL/PLATELET - Abnormal; Notable for the following components:   WBC 11.9 (*)    Neutro Abs 9.0 (*)    All other components within normal limits  COMPREHENSIVE METABOLIC PANEL - Abnormal; Notable for the following  components:   Creatinine, Ser 1.28 (*)    All other components within normal limits  LIPASE, BLOOD  POC URINE PREG, ED  GC/CHLAMYDIA PROBE AMP ([place]) NOT AT [facility]    EKG None  Radiology No results found.  Procedures Procedures (including critical care time)  Medications Ordered in ED Medications  morphine 4 MG/ML injection 4  mg (4 mg Intravenous Not Given 06/17/18 2153)  ondansetron [facility]) injection 4 mg (4 mg Intravenous Given 06/17/18 2151)  ketorolac (TORADOL) 30 MG/ML injection 30 mg (30 mg Intravenous Given 06/17/18 2151)  iohexol (OMNIPAQUE) 300 MG/ML solution 100 mL (100 mLs Intravenous Contrast Given 06/17/18 2244)     Initial Impression / Assessment and Plan / ED Course  I have reviewed the triage vital signs and the nursing notes.  Pertinent labs & imaging results that were available during my care of the patient were reviewed by me and considered in my medical decision making (see chart for details).        ddx includes viral process.  UA is without infection, RBCs and she has no fever, CVAT.  Lower suspicion for UTI/pylonephritis, could still be renal stone with hematuria.  Pelvic exam without adnexal fullness/tenderness, CMT making pelvic process such as STD, PID, TOA less likely. She does not want empiric treat for STD today, this is reasonable her symptoms are not suggestive of STD. Will obtain labs, reassess.   2255: WBC 11.9, creatinine 1.28.  No fever, tachycardia. No SIRS criteria.  Wet prep with clue cells but she has no discharge, negative whiff test and will defer tx for BV as she has no symptoms of this.  She has persistent although milder RLQ tenderness, leukocytosis, will obtain CTAP for further evaluation of RLQ/suprapubic pain.  Patient will be handed off to oncoming EDPA who will f/u on CTAP and determine disposition. If negative, anticipate discharge with zofran, supportive management for likely viral process.  Final Clinical Impressions(s) / ED  Diagnoses   Final diagnoses:  Right lower quadrant abdominal pain  Lower urinary tract symptoms    ED Discharge Orders    None       Kinnie Feil, PA-C 06/17/18 2302    Drenda Freeze, MD 06/18/18 [phone]

## 2018-06-17 NOTE — ED Provider Notes (Signed)
Assumed care from PA Gibbons at shift change.  See prior notes for full H&P.  Briefly, 18 y.o. Leah [name] here with right sided abdominal pain for the past week.  Seen at [facility] and treated for UTI but urine was negative.  Has had some nausea.  Denies pelvic pain or vaginal discharge.  Labs overall reassuring.  Plan:  CT pending, follow results and dispo.  Results for orders placed or performed during the hospital encounter of 06/17/18  Wet prep, genital  Result Value Ref Range   Yeast Wet Prep HPF POC NONE SEEN NONE SEEN   Trich, Wet Prep NONE SEEN NONE SEEN   Clue Cells Wet Prep HPF POC PRESENT (A) NONE SEEN   WBC, Wet Prep HPF POC FEW (A) NONE SEEN   Sperm NONE SEEN   Urinalysis, Routine w reflex microscopic  Result Value Ref Range   Color, Urine COLORLESS (A) YELLOW   APPearance CLEAR CLEAR   Specific Gravity, Urine 1.003 (L) 1.005 - 1.030   pH 7.0 5.0 - 8.0   Glucose, UA NEGATIVE NEGATIVE mg/dL   Hgb urine dipstick NEGATIVE NEGATIVE   Bilirubin Urine NEGATIVE NEGATIVE   Ketones, ur NEGATIVE NEGATIVE mg/dL   Protein, ur NEGATIVE NEGATIVE mg/dL   Nitrite NEGATIVE NEGATIVE   Leukocytes,Ua NEGATIVE NEGATIVE  CBC with Differential  Result Value Ref Range   WBC 11.9 (H) 4.0 - 10.5 K/uL   RBC 4.67 3.87 - 5.11 MIL/uL   Hemoglobin 13.6 12.0 - 15.0 g/dL   HCT 40.5 36.0 - 46.0 %   MCV 86.7 80.0 - 100.0 fL   MCH 29.1 26.0 - 34.0 pg   MCHC 33.6 30.0 - 36.0 g/dL   RDW 11.9 11.5 - 15.5 %   Platelets 277 150 - 400 K/uL   nRBC 0.0 0.0 - 0.2 %   Neutrophils Relative % 75 %   Neutro Abs 9.0 (H) 1.7 - 7.7 K/uL   Lymphocytes Relative 15 %   Lymphs Abs 1.7 0.7 - 4.0 K/uL   Monocytes Relative 9 %   Monocytes Absolute 1.0 0.1 - 1.0 K/uL   Eosinophils Relative 1 %   Eosinophils Absolute 0.1 0.0 - 0.5 K/uL   Basophils Relative 0 %   Basophils Absolute 0.0 0.0 - 0.1 K/uL   Immature Granulocytes 0 %   Abs Immature Granulocytes 0.03 0.00 - 0.07 K/uL  Comprehensive metabolic panel  Result Value Ref Range    Sodium 137 135 - 145 mmol/L   Potassium 3.9 3.5 - 5.1 mmol/L   Chloride 101 98 - 111 mmol/L   CO2 25 22 - 32 mmol/L   Glucose, Bld 88 70 - 99 mg/dL   BUN 13 6 - 20 mg/dL   Creatinine, Ser 1.28 (H) 0.44 - 1.00 mg/dL   Calcium 9.5 8.9 - 10.3 mg/dL   Total Protein 7.5 6.5 - 8.1 g/dL   Albumin 4.3 3.5 - 5.0 g/dL   AST 20 15 - 41 U/L   ALT 18 0 - 44 U/L   Alkaline Phosphatase 91 38 - 126 U/L   Total Bilirubin 0.4 0.3 - 1.2 mg/dL   GFR calc non Af Amer >60 >60 mL/min   GFR calc Af Amer >60 >60 mL/min   Anion gap 11 5 - 15  Lipase, blood  Result Value Ref Range   Lipase 26 11 - 51 U/L  POC Urine Pregnancy, ED  (not at [facility])  Result Value Ref Range   Preg Test, Ur NEGATIVE NEGATIVE   Ct  Abdomen Pelvis W Contrast  Result Date: 06/17/2018 CLINICAL DATA:  Initial evaluation for acute abdominal pain, nausea, vomiting, interval diarrhea. EXAM: CT ABDOMEN AND PELVIS WITH CONTRAST TECHNIQUE: Multidetector CT imaging of the abdomen and pelvis was performed using the standard protocol following bolus administration of intravenous contrast. CONTRAST:  167mL OMNIPAQUE IOHEXOL 300 MG/ML  SOLN COMPARISON:  Prior ultrasound from 03/26/2018. FINDINGS: Lower chest: Visualized lung bases are clear. Hepatobiliary: Liver demonstrates a normal contrast enhanced appearance. Gallbladder within normal limits. No biliary dilatation. Pancreas: Pancreas within normal limits. Spleen: Spleen within normal limits. Adrenals/Urinary Tract: Adrenal glands are normal. Asymmetric enlargement of the right kidney with delayed nephrogram. There is a 6 mm obstructive stone position within the distal right ureter with secondary mild right hydroureteronephrosis. No other radiopaque calculi seen within the right kidney or along the course of the right renal collecting system. Left kidney unremarkable without nephrolithiasis or hydronephrosis. No focal enhancing renal mass seen about either kidney. Bladder partially distended without  acute finding. No layering stones within the bladder lumen. Stomach/Bowel: Stomach within normal limits. No evidence for bowel obstruction. No evidence for acute appendicitis. No acute inflammatory changes seen about the bowels. Vascular/Lymphatic: Normal intravascular enhancement seen throughout the intra-abdominal aorta and its branch vessels. No adenopathy. Reproductive: Uterus and ovaries within normal limits for age. Other: No free air or fluid. Small fat containing paraumbilical hernia without associated inflammation. Musculoskeletal: No acute osseous finding. No discrete lytic or blastic osseous lesions. IMPRESSION: 1. 6 mm obstructive stone with distal right ureter with secondary mild right hydroureteronephrosis. 2. No other acute intra-abdominal or pelvic process. Electronically Signed   By: Jeannine Boga M.D.   On: 06/17/2018 23:08   CT revealing 64mm right distal ureteral stone.  We discussed results and expectant management.  Will discharge with prescriptions for percocet, zofran, flomax.  Urology follow-up given.  She will return here for any new/acute changes including fever, inability to urinate, uncontrolled vomiting, etc.     Larene Pickett, PA-C 06/17/18 2325    Drenda Freeze, MD 06/18/18 [phone]

## 2018-06-17 NOTE — ED Triage Notes (Signed)
Pt from home c/o right lower back pain radiating to lower abdomen. Was seen [facility] today at noon given ciprofloxacin for UTI.

## 2018-06-17 NOTE — Discharge Instructions (Addendum)
Take the prescribed medication as directed.  Do not drive or make important decisions while taking pain medication. Make sure to drink plenty of water.  Stone should pass in the next few days. Follow-up with urology. Return to the ED for new or worsening symptoms-- high fever, uncontrolled vomiting or pain, trouble urinating, etc.

## 2018-06-18 LAB — GC/CHLAMYDIA PROBE AMP (~~LOC~~) NOT AT ARMC
Chlamydia: NEGATIVE
Neisseria Gonorrhea: NEGATIVE

## 2018-06-25 ENCOUNTER — Ambulatory Visit: Payer: Medicaid Other | Admitting: Family Medicine

## 2018-07-30 DIAGNOSIS — N898 Other specified noninflammatory disorders of vagina: Secondary | ICD-10-CM | POA: Diagnosis not present

## 2018-08-10 DIAGNOSIS — Z1159 Encounter for screening for other viral diseases: Secondary | ICD-10-CM | POA: Diagnosis not present

## 2018-09-09 ENCOUNTER — Ambulatory Visit: Payer: Medicaid Other | Admitting: Family Medicine

## 2018-09-09 NOTE — Progress Notes (Deleted)
    Subjective:  Leah [name] is a 18 y.o. female who presents to the [facility] today with a chief complaint of ***.   HPI:   ***HIST  CC, SH/smoking status, and VS noted  Objective:   There were no vitals filed for this visit.  Physical Exam: There were no vitals taken for this visit.    Gen: ***NAD, resting comfortably CV: RRR with no murmurs appreciated Pulm: NWOB, CTAB with no crackles, wheezes, or rhonchi GI: Normal bowel sounds present. Soft, Nontender, Nondistended. MSK: no edema, cyanosis, or clubbing noted Skin: warm, dry Neuro: grossly normal, moves all extremities Psych: Normal affect and thought content  No results found for this or any previous visit (from the past 72 hour(s)).   Assessment/Plan:  No problem-specific Assessment & Plan notes found for this encounter.    No orders of the defined types were placed in this encounter.   No orders of the defined types were placed in this encounter.   Health Maintenance reviewed - {health maintenance:315237}.  Lyndee Hensen, DO PGY-1, [facility] 09/09/2018 2:46 PM

## 2018-09-21 DIAGNOSIS — R1030 Lower abdominal pain, unspecified: Secondary | ICD-10-CM | POA: Diagnosis not present

## 2018-10-29 ENCOUNTER — Ambulatory Visit: Payer: Medicaid Other | Admitting: Family Medicine

## 2018-11-12 ENCOUNTER — Other Ambulatory Visit: Payer: Self-pay

## 2018-11-12 ENCOUNTER — Ambulatory Visit (INDEPENDENT_AMBULATORY_CARE_PROVIDER_SITE_OTHER): Payer: Medicaid Other | Admitting: Family Medicine

## 2018-11-12 VITALS — BP 106/62 | HR 92 | Ht 66.0 in | Wt 204.0 lb

## 2018-11-12 DIAGNOSIS — R399 Unspecified symptoms and signs involving the genitourinary system: Secondary | ICD-10-CM | POA: Diagnosis not present

## 2018-11-12 LAB — POCT URINALYSIS DIP (MANUAL ENTRY)
Bilirubin, UA: NEGATIVE
Blood, UA: NEGATIVE
Glucose, UA: NEGATIVE mg/dL
Ketones, POC UA: NEGATIVE mg/dL
Leukocytes, UA: NEGATIVE
Nitrite, UA: NEGATIVE
Protein Ur, POC: NEGATIVE mg/dL
Spec Grav, UA: >=1.03 — AB (ref 1.010–1.025)
Urobilinogen, UA: 0.2 E.U./dL
pH, UA: 7 (ref 5.0–8.0)

## 2018-11-12 NOTE — Patient Instructions (Addendum)
I will put in a urine culture.  It should take two days at least to result.  I will let you know when I get the results.  If it is positive I will call you to prescribe an antibiotic.  If it is negative we may have to do additional testing.    To help prevent UTIs in the future, drink plenty of water, try drinking cranberry juice, void after sex, and wipe front to back when urinating.    Have a great day,   Clemetine Marker, MD

## 2018-11-12 NOTE — Progress Notes (Signed)
   [facility] Phone: [phone]     Leah [name] - 18 y.o. female MRN [id number]  Date of birth: [birth year]  Subjective:   cc: UTI symptoms  HPI:  Last had a UTI about one month ago.  Improved with abx, unsure which kind.  Experiencing strong odor and LLQ cramps once a day.  They occur only when going to the bathroom.  Has had kidney stones in the past, this feels more like a UTI.  Has had some discharge, but not excessive amount.  No vaginal itching or pain.  Sexually active last week.  One partner, no condoms.  Not on birth control. Pain does not radiate to her back.  Not interested in birth control.   ROS: See HPI for pertinent positives and negatives  Past Medical History reviewed.  Is significant for chlamydia and UTI.  Family history reviewed for today's visit. No changes.   Objective:   BP 106/62   Pulse 92   Ht 5\' 6"  (1.676 m)   Wt 204 lb (92.5 kg)   LMP 11/10/2018   SpO2 99%   BMI 32.93 kg/m  Gen: NAD, alert and oriented, cooperative with exam CV: normal rate, regular rhythm. No murmurs, no rubs.  Resp: LCTAB, no wheezes, crackles. normal work of breathing GI: minimal LLQ TTP, BS present, no guarding or organomegaly Psych: Appropriate behavior  Assessment/Plan:   UTI symptoms Frequent UTIs in the past, states this feels like prior episodes.  Also has had history of STI including chlamydia in recent past.  Main symptoms are odor and left lower quadrant crampiness when using the bathroom.  Possible these symptoms represent renal stones, which she has had in the past as well.  Urinalysis was negative, will send off for culture given patient report of previous negative urinalysis with positive culture. - No prescription for antibiotics at the moment, will wait for culture results. -Further work-up for STI had he sounds possibly if culture negative and patient still complaining of symptoms.  Clemetine Marker, MD PGY-2 [facility] Family Medicine  Residency

## 2018-11-14 DIAGNOSIS — R399 Unspecified symptoms and signs involving the genitourinary system: Secondary | ICD-10-CM | POA: Insufficient documentation

## 2018-11-14 LAB — URINE CULTURE

## 2018-11-14 NOTE — Assessment & Plan Note (Signed)
Frequent UTIs in the past, states this feels like prior episodes.  Also has had history of STI including chlamydia in recent past.  Main symptoms are odor and left lower quadrant crampiness when using the bathroom.  Possible these symptoms represent renal stones, which she has had in the past as well.  Urinalysis was negative, will send off for culture given patient report of previous negative urinalysis with positive culture. - No prescription for antibiotics at the moment, will wait for culture results. -Further work-up for STI had he sounds possibly if culture negative and patient still complaining of symptoms.

## 2018-11-18 ENCOUNTER — Other Ambulatory Visit: Payer: Self-pay | Admitting: Family Medicine

## 2018-11-18 MED ORDER — CEPHALEXIN 500 MG PO CAPS: 500.0000 mg | ORAL_CAPSULE | Freq: Four times a day (QID) | ORAL | 0 refills | Status: AC

## 2018-11-18 NOTE — Progress Notes (Signed)
Urine culture positive. Keflex 4xdaily x 5days

## 2018-11-19 ENCOUNTER — Encounter: Payer: Self-pay | Admitting: General Practice

## 2018-11-24 ENCOUNTER — Encounter: Payer: Self-pay | Admitting: Family Medicine

## 2018-11-29 ENCOUNTER — Telehealth (INDEPENDENT_AMBULATORY_CARE_PROVIDER_SITE_OTHER): Payer: Medicaid Other | Admitting: Family Medicine

## 2018-11-29 DIAGNOSIS — B373 Candidiasis of vulva and vagina: Secondary | ICD-10-CM

## 2018-11-29 MED ORDER — FLUCONAZOLE 150 MG PO TABS: 150.0000 mg | ORAL_TABLET | Freq: Once | ORAL | 0 refills | Status: AC

## 2018-11-29 NOTE — Progress Notes (Signed)
[facility] Telemedicine Visit  Patient consented to have virtual visit. Method of visit: Telephone  Encounter participants: Patient: Leah [name] - located at home Provider: Bon[place]ita [place] - located at home Others (if applicable): n/a  Chief Complaint: Yeast infection  HPI:  Patient complaining reproducible vaginal discharge.  Patient was recently treated for a UTI earlier in October.  The symptoms came on after she completed antibiotics.  Describes the vaginal discharge is whitish.  She is not having dysuria, hematuria, abdominal pain, fevers, chills.  She is able tolerate p.o.  ROS: per HPI  Pertinent PMHx: Recent UTI, history of chlamydia  Exam:  Respiratory: Able speak in full sentences without issue  Assessment/Plan:  Vaginal discharge Likely Candida vaginitis status post recent antibiotic use.  Although she has a history of STI, unlikely given clinical presentation.  Recommend patient trial Diflucan x1.  If no improvement patient should come in for vaginal exam and vaginal discharge work-up.  No problem-specific Assessment & Plan notes found for this encounter.    Time spent during visit with patient: 7 minutes

## 2018-12-08 ENCOUNTER — Other Ambulatory Visit (HOSPITAL_COMMUNITY)
Admission: RE | Admit: 2018-12-08 | Discharge: 2018-12-08 | Disposition: A | Discharge status: Home / Self Care | Payer: Medicaid Other | Source: Ambulatory Visit | Attending: Family Medicine | Admitting: Family Medicine

## 2018-12-08 ENCOUNTER — Other Ambulatory Visit: Payer: Self-pay

## 2018-12-08 ENCOUNTER — Ambulatory Visit (INDEPENDENT_AMBULATORY_CARE_PROVIDER_SITE_OTHER): Payer: Medicaid Other | Admitting: Family Medicine

## 2018-12-08 VITALS — BP 102/60 | HR 62 | Wt 202.8 lb

## 2018-12-08 DIAGNOSIS — Z8619 Personal history of other infectious and parasitic diseases: Secondary | ICD-10-CM

## 2018-12-08 DIAGNOSIS — R399 Unspecified symptoms and signs involving the genitourinary system: Secondary | ICD-10-CM | POA: Diagnosis not present

## 2018-12-08 DIAGNOSIS — Z113 Encounter for screening for infections with a predominantly sexual mode of transmission: Secondary | ICD-10-CM | POA: Insufficient documentation

## 2018-12-08 LAB — POCT WET PREP (WET MOUNT)
Clue Cells Wet Prep Whiff POC: NEGATIVE
Trichomonas Wet Prep HPF POC: ABSENT

## 2018-12-08 LAB — POCT URINALYSIS DIP (MANUAL ENTRY)
Bilirubin, UA: NEGATIVE
Glucose, UA: NEGATIVE mg/dL
Ketones, POC UA: NEGATIVE mg/dL
Nitrite, UA: NEGATIVE
Protein Ur, POC: NEGATIVE mg/dL
Spec Grav, UA: >=1.03 — AB (ref 1.010–1.025)
Urobilinogen, UA: 0.2 E.U./dL
pH, UA: 5.5 (ref 5.0–8.0)

## 2018-12-08 LAB — POCT UA - MICROSCOPIC ONLY: RBC, urine, microscopic: >20

## 2018-12-08 MED ORDER — CEPHALEXIN 500 MG PO CAPS: 500.0000 mg | ORAL_CAPSULE | Freq: Three times a day (TID) | ORAL | 0 refills | Status: DC

## 2018-12-08 MED ORDER — FLUCONAZOLE 150 MG PO TABS: 150.0000 mg | ORAL_TABLET | Freq: Once | ORAL | 0 refills | Status: AC

## 2018-12-08 NOTE — Patient Instructions (Signed)
It was great meeting you today!  We can go ahead and treat you for UTI even though the urinalysis did not show that.  The confirmatory test is a urine culture, but with common things being common your symptoms are likely caused by UTI.  Your wet prep did not show any abnormality.  Your gonorrhea and Chlamydia test should come back in couple days.  We also get some blood work on the way out and I will call back with that.

## 2018-12-09 LAB — HIV ANTIBODY (ROUTINE TESTING W REFLEX): HIV Screen 4th Generation wRfx: NONREACTIVE

## 2018-12-09 LAB — RPR: RPR Ser Ql: NONREACTIVE

## 2018-12-10 LAB — CERVICOVAGINAL ANCILLARY ONLY
Chlamydia: NEGATIVE
Comment: NEGATIVE
Comment: NORMAL
Neisseria Gonorrhea: NEGATIVE

## 2018-12-13 ENCOUNTER — Encounter: Payer: Self-pay | Admitting: Family Medicine

## 2018-12-13 NOTE — Assessment & Plan Note (Addendum)
GC/chlamydia along with RPR and HIV checked at this visit.  All were negative.

## 2018-12-13 NOTE — Progress Notes (Signed)
   HPI 18 year old female who presents for dysuria.  She states her symptoms been going on for 2 to 3 days and occur only when she urinates.  She does not try nothing for pain relief.  She feels like this is "just like the urinary tract infections have had in the past".  Of note last positive culture was 11/12/2018 and grew out greater than 100k colony-forming units of E. Coli.  She denies any new symptoms such as vaginal itching, discharge.   CC: Dysuria   ROS:   Review of Systems See HPI for ROS.   CC, SH/smoking status, and VS noted  Objective: BP 102/60   Pulse 62   Wt 202 lb 12.8 oz (92 kg)   LMP 12/08/2018 (Exact Date)   SpO2 98%   BMI 32.73 kg/m  Gen: 18 year old African-American female, no acute distress, resting comfortably CV: Skin warm and dry Resp: No accessory muscle use Abd: SNTND, BS present, no guarding or organomegaly Neuro: Alert and oriented, Speech clear, No gross deficits GU: Cervix easily visualized, no foul-smelling odor appreciated, no discharge appreciated Wet prep: Negative abnormality UA: Greater than 1.03 spec graph, large blood, trace leukocytes Hpf: 1-5 WBC, RBC greater than 20, 2+ bacteria, 10-15 epithelial cells  Assessment and plan:  UTI symptoms We will him treat empirically for UTI based on UA and microscopy.  Unfortunately urine culture was not ordered.  Patient to follow-up as needed if symptoms continue or do not improve.  Prescribed Keflex 500 mg 3 times daily for 5 days.  Patient has history of yeast infections with antibiotics.  Will give Diflucan 150 mg x 1.  Start the patient only use this if she has symptoms of start.  Patient states that she uses condoms and that there is "no way she could be pregnant".  History of chlamydia RPR and HIV checked at this visit.  Both were negative.   Orders Placed This Encounter  Procedures  . RPR  . HIV antibody (with reflex)  . POCT Wet Prep [employer])  . POCT urinalysis dipstick  . POCT UA  - Microscopic Only    Meds ordered this encounter  Medications  . cephALEXin (KEFLEX) 500 MG capsule    Sig: Take 1 capsule (500 mg total) by mouth 3 (three) times daily.    Dispense:  15 capsule    Refill:  0  . fluconazole (DIFLUCAN) 150 MG tablet    Sig: Take 1 tablet (150 mg total) by mouth once for 1 dose.    Dispense:  1 tablet    Refill:  0     Guadalupe Dawn MD PGY-3 Family Medicine Resident  12/13/2018 6:46 PM

## 2018-12-13 NOTE — Assessment & Plan Note (Addendum)
We will him treat empirically for UTI based on UA and microscopy.  Unfortunately urine culture was not ordered.  Patient to follow-up as needed if symptoms continue or do not improve.  Prescribed Keflex 500 mg 3 times daily for 5 days.  Patient has history of yeast infections with antibiotics.  Will give Diflucan 150 mg x 1.  Start the patient only use this if she has symptoms of start.  Patient states that she uses condoms and that there is "no way she could be pregnant".

## 2018-12-23 ENCOUNTER — Ambulatory Visit: Payer: Medicaid Other | Admitting: Obstetrics and Gynecology

## 2019-01-28 ENCOUNTER — Ambulatory Visit: Payer: Medicaid Other

## 2019-01-31 ENCOUNTER — Telehealth: Payer: Self-pay | Admitting: Family Medicine

## 2019-01-31 ENCOUNTER — Encounter: Payer: Self-pay | Admitting: Family Medicine

## 2019-01-31 ENCOUNTER — Ambulatory Visit: Payer: Medicaid Other | Admitting: Family Medicine

## 2019-01-31 NOTE — Telephone Encounter (Signed)
Patient was called at [phone]. She answered. Was told that she had an appt today with the [facility], said she meant to reschedule it. Was asking if she could cancel it now, I told her no as her appt was scheduled for 25 minutes ago.   The patient was told that per clinic policy 3 no-shows or cancellations within 24 hours of an appt would lead to the patient being dismissed from the clinic. The patient has had 5 no shows for the year of 2020. Now that the patient has been notified verbally, I will also send the patient a copy of our late policy. Since there is no record of patient being notified of No Show policy, I recommend we give her grace for this one visit and that one more no show lead to her dismissal from the clinic.   Milus Banister, [place], PGY-2 01/31/2019 4:05 PM

## 2019-02-11 HISTORY — PX: LIPOSUCTION: SHX10

## 2019-02-22 ENCOUNTER — Emergency Department (HOSPITAL_COMMUNITY)
Admission: EM | Admit: 2019-02-22 | Discharge: 2019-02-22 | Discharge status: Against Medical Advice | Payer: Medicaid Other | Attending: Emergency Medicine | Admitting: Emergency Medicine

## 2019-02-22 DIAGNOSIS — Z5321 Procedure and treatment not carried out due to patient leaving prior to being seen by health care provider: Secondary | ICD-10-CM | POA: Diagnosis not present

## 2019-02-22 DIAGNOSIS — M545 Low back pain: Secondary | ICD-10-CM | POA: Diagnosis present

## 2019-02-22 NOTE — ED Notes (Signed)
Pt stated they were leaving  

## 2019-02-23 ENCOUNTER — Ambulatory Visit: Payer: Medicaid Other | Admitting: Family Medicine

## 2019-02-28 ENCOUNTER — Other Ambulatory Visit (HOSPITAL_COMMUNITY)
Admission: RE | Admit: 2019-02-28 | Discharge: 2019-02-28 | Disposition: A | Discharge status: Home / Self Care | Payer: Medicaid Other | Source: Ambulatory Visit | Attending: Family Medicine | Admitting: Family Medicine

## 2019-02-28 ENCOUNTER — Ambulatory Visit (INDEPENDENT_AMBULATORY_CARE_PROVIDER_SITE_OTHER): Payer: Medicaid Other | Admitting: Student in an Organized Health Care Education/Training Program

## 2019-02-28 ENCOUNTER — Other Ambulatory Visit: Payer: Self-pay

## 2019-02-28 VITALS — BP 102/68 | HR 72 | Wt 199.6 lb

## 2019-02-28 DIAGNOSIS — R109 Unspecified abdominal pain: Secondary | ICD-10-CM

## 2019-02-28 DIAGNOSIS — N898 Other specified noninflammatory disorders of vagina: Secondary | ICD-10-CM

## 2019-02-28 DIAGNOSIS — R399 Unspecified symptoms and signs involving the genitourinary system: Secondary | ICD-10-CM | POA: Diagnosis not present

## 2019-02-28 DIAGNOSIS — H9203 Otalgia, bilateral: Secondary | ICD-10-CM

## 2019-02-28 LAB — POCT URINALYSIS DIP (MANUAL ENTRY)
Bilirubin, UA: NEGATIVE
Blood, UA: NEGATIVE
Glucose, UA: NEGATIVE mg/dL
Ketones, POC UA: NEGATIVE mg/dL
Leukocytes, UA: NEGATIVE
Nitrite, UA: NEGATIVE
Protein Ur, POC: NEGATIVE mg/dL
Spec Grav, UA: 1.025 (ref 1.010–1.025)
Urobilinogen, UA: 0.2 E.U./dL
pH, UA: 5.5 (ref 5.0–8.0)

## 2019-02-28 LAB — POCT URINE PREGNANCY: Preg Test, Ur: NEGATIVE

## 2019-02-28 LAB — POCT WET PREP (WET MOUNT)
Clue Cells Wet Prep Whiff POC: NEGATIVE
Trichomonas Wet Prep HPF POC: ABSENT

## 2019-02-28 MED ORDER — CIPROFLOXACIN-DEXAMETHASONE 0.3-0.1 % OT SUSP: 4.0000 [drp] | Freq: Two times a day (BID) | OTIC | 0 refills | Status: AC

## 2019-02-28 NOTE — Progress Notes (Signed)
   Subjective:    Patient ID: Leah [name], female    DOB: 07/26/2000, 19 y.o.   MRN: [id number]  CC: ears, Urinary symptoms  HPI:  Urinary symptoms- RLQ pain that starts from flank and radiates forward. Described as stabbing pain which has improved. Denies dysuria. Endorses frequency, urgency, fishy smell to urine. Has menogomous female partner without concern for STI but has history of chlamydia, trich, and frequent BV infections. Has white discharge. Negative for redness/blood to urine.   Has had a few weeks of ear discomfort which as initially worse in right and is now worse in left. Endorses muffled hearing with occasional ringing. Some pain in left ear. Denies any balance issues or signs of vertigo. Negative for drainage. No changes in vision or sinus pressure/ sinus drainage. No other sick symptoms. Denies allergy history.  Cerumen impaction of right ear Otitis externa of left ear  Smoking status reviewed   ROS: pertinent noted in the HPI   I have personally reviewed pertinent past medical history, surgical, family, and social history as appropriate. Objective:  BP 102/68   Pulse 72   Wt 199 lb 9.6 oz (90.5 kg)   LMP 02/18/2019 (Exact Date)   SpO2 98%   BMI 32.22 kg/m   Vitals and nursing note reviewed  General: NAD, pleasant, able to participate in exam HEENT: Ear- R ear- unable to visualize TM with cerumen impaction but canal is non-tender or erythematous.  L ear- most of TM not able to visualize due to cerumen and is positive for tenderness on exam. Pelvic:  External genitalia- no abnormalities seen. Mild discharge without erythema or tenderness to cervical exam. Extremities: no edema or cyanosis. Skin: warm and dry, no rashes noted Neuro: alert, no obvious focal deficits Psych: Normal affect and mood  Assessment & Plan:   UTI symptoms Patient without signs of pelvic infection. Wet prep and STI screening negative.   Ear discomfort, bilateral Physical exam by  myself and confirmed by Dr. Gwendlyn Deutscher who recommend treatment plan.  Right ear received lavage Left ear received treatment for otitis externa (ciprodex)  Orders Placed This Encounter  Procedures  . POCT urinalysis dipstick  . POCT urine pregnancy  . POCT Wet Prep [facility])    Meds ordered this encounter  Medications  . ciprofloxacin-dexamethasone (CIPRODEX) OTIC suspension    Sig: Place 4 drops into the left ear 2 (two) times daily for 7 days.    Dispense:  2.8 mL    Refill:  0    Doristine Mango, [place] PGY-2

## 2019-02-28 NOTE — Patient Instructions (Signed)
It was a pleasure to see you today!  To summarize our discussion for this visit:  For your hearing issues, we will clean out your right ear and use drops to clear out possible infection in left ear.   I will let you know if there is a sign of infection in the pelvic exam we did today and open your pap smear results on mychart.   Some additional health maintenance measures we should update are: Health Maintenance Due  Topic Date Due  . TETANUS/TDAP  02/13/2017  . INFLUENZA VACCINE  09/11/2018  . PAP-Cervical Cytology Screening  02/14/2019  . PAP SMEAR-Modifier  02/14/2019  .    Please return to our clinic to see [place] as needed.  Call the clinic at [phone] if your symptoms worsen or you have any concerns.   Thank you for allowing me to take part in your care,  Dr. Doristine Mango

## 2019-03-02 DIAGNOSIS — H9203 Otalgia, bilateral: Secondary | ICD-10-CM | POA: Insufficient documentation

## 2019-03-02 LAB — CYTOLOGY - PAP
Chlamydia: NEGATIVE
Comment: NEGATIVE
Comment: NORMAL
Diagnosis: NEGATIVE
Neisseria Gonorrhea: NEGATIVE

## 2019-03-02 NOTE — Assessment & Plan Note (Signed)
Physical exam by myself and confirmed by Dr. Gwendlyn Deutscher who recommend treatment plan.  Right ear received lavage Left ear received treatment for otitis externa (ciprodex)

## 2019-03-02 NOTE — Assessment & Plan Note (Signed)
Patient without signs of pelvic infection. Wet prep and STI screening negative.

## 2019-03-08 ENCOUNTER — Encounter: Payer: Self-pay | Admitting: Student in an Organized Health Care Education/Training Program

## 2019-03-08 DIAGNOSIS — H6122 Impacted cerumen, left ear: Secondary | ICD-10-CM | POA: Diagnosis not present

## 2019-03-17 DIAGNOSIS — R6883 Chills (without fever): Secondary | ICD-10-CM | POA: Diagnosis not present

## 2019-03-17 DIAGNOSIS — J020 Streptococcal pharyngitis: Secondary | ICD-10-CM | POA: Diagnosis not present

## 2019-03-17 DIAGNOSIS — Z1152 Encounter for screening for COVID-19: Secondary | ICD-10-CM | POA: Diagnosis not present

## 2019-03-17 DIAGNOSIS — R52 Pain, unspecified: Secondary | ICD-10-CM | POA: Diagnosis not present

## 2019-03-17 DIAGNOSIS — R05 Cough: Secondary | ICD-10-CM | POA: Diagnosis not present

## 2019-03-17 DIAGNOSIS — J029 Acute pharyngitis, unspecified: Secondary | ICD-10-CM | POA: Diagnosis not present

## 2019-04-14 ENCOUNTER — Encounter: Payer: Self-pay | Admitting: Family Medicine

## 2019-04-14 ENCOUNTER — Ambulatory Visit (INDEPENDENT_AMBULATORY_CARE_PROVIDER_SITE_OTHER): Payer: Medicaid Other | Admitting: Family Medicine

## 2019-04-14 ENCOUNTER — Other Ambulatory Visit: Payer: Self-pay

## 2019-04-14 VITALS — BP 122/74 | HR 92 | Ht 66.0 in | Wt 199.0 lb

## 2019-04-14 DIAGNOSIS — Z01818 Encounter for other preprocedural examination: Secondary | ICD-10-CM | POA: Diagnosis not present

## 2019-04-14 NOTE — Patient Instructions (Signed)
Liposuction Liposuction is a surgical procedure to remove fat deposits from under your skin. It is often done to remove fat from the thighs, buttocks, belly, or upper arms. You may choose to have this procedure if you want to slim or reshape areas of your body. Tell your health care provider about:  Any allergies you have.  All medicines you are taking, including vitamins, herbs, eye drops, creams, and over-the-counter medicines.  Any problems you or family members have had with anesthetic medicines.  Any blood disorders you have.  Any surgeries you have had.  Any medical conditions you have.  The last time you used tobacco and nicotine products.  Whether you are pregnant or may be pregnant. What are the risks? Generally, this is a safe procedure. However, problems may occur, including:  Bleeding.  Bruising.  Infection.  Scarring.  Numbness.  Lumpy, sagging, or rippled skin.  Pain.  Swelling.  Changes in the color of the skin.  Pockets of fluid under the skin. What happens before the procedure?  Staying hydrated Follow instructions from your health care provider about hydration, which may include:  Up to 2 hours before the procedure - you may continue to drink clear liquids, such as water, clear fruit juice, black coffee, and plain tea.  Eating and drinking restrictions Follow instructions from your health care provider about eating and drinking, which may include:  8 hours before the procedure - stop eating heavy meals or foods, such as meat, fried foods, or fatty foods.  6 hours before the procedure - stop eating light meals or foods, such as toast or cereal.  6 hours before the procedure - stop drinking milk or drinks that contain milk.  2 hours before the procedure - stop drinking clear liquids. Medicines Ask your health care provider about:  Changing or stopping your regular medicines. This is especially important if you are taking diabetes medicines  or blood thinners.  Taking medicines such as aspirin and ibuprofen. These medicines can thin your blood. Do not take these medicines unless your health care provider tells you to take them.  Taking over-the-counter medicines, vitamins, herbs, and supplements. General instructions  Do not use any products that contain nicotine or tobacco, such as cigarettes and e-cigarettes, for at least 4 weeks before your procedure. If you need help quitting, ask your health care provider. Using nicotine and tobacco products can delay incision healing after surgery.  Plan to have someone take you home from the hospital or clinic.  Plan to have a responsible adult care for you for at least 24 hours after you leave the hospital or clinic. This is important.  Ask your health care provider how your surgical site will be marked. Ensure that you and your health care provider agree on and mark the areas to be treated.  Ask your health care provider what steps will be taken to help prevent infection. These may include: ? Removing hair at the surgery site. ? Washing skin with a germ-killing soap. ? Taking antibiotic medicine. What happens during the procedure?   An IV will be started in your hand or arm.  You will be given one or more of the following: ? A medicine to help you relax (sedative). ? A medicine to numb the area (local anesthetic). ? A medicine to make you fall asleep (general anesthetic).  Cuts (incisions) will be made in your skin.  A thin, hollow tube (cannula) will be inserted through the incision.  A combination of salt-water (  saline) solution, numbing medicine, and medicine to reduce bleeding will be run through the cannula.  The cannula will be moved back and forth to break up fat cells. In some cases, sound waves (ultrasound) or laser energy will also be used to break up fat cells.  A suctioning device will be attached to the cannula. It will be used to remove fluid and fat cells from  under your skin.  You may be given more fluid through your IV to replace fluids lost through suction.  Your small incisions will be closed with stitches (sutures), glue, or adhesive strips.  Gauze pads will be placed over the incisions.  A compression garment or elastic bandage (dressing) will be placed over or around the surgical area. The procedure may vary among health care providers and hospitals. What happens after the procedure?  Your blood pressure, heart rate, breathing rate, and blood oxygen level will be monitored until you leave the hospital or clinic.  Do not drive for 24 hours if you were given a sedative during your procedure. Summary  Liposuction is a method of removing excess fat from your body.  Follow instructions about eating and drinking restrictions before the procedure.  You will be given medicines to make you relax and to block pain while the procedure is done.  Incisions will be made on your skin. Salt water and medicines will be used to break up fat in your body. A suctioning device will be used to remove the fat and fluids. Incisions will be closed with sutures, glue, or adhesive strips.  Do not drive for 24 hours if you were given a sedative during the procedure. This information is not intended to replace advice given to you by your health care provider. Make sure you discuss any questions you have with your health care provider. Document Revised: 06/17/2017 Document Reviewed: 06/17/2017 Elsevier Patient Education  2020 Elsevier Inc.  

## 2019-04-15 ENCOUNTER — Encounter: Payer: Self-pay | Admitting: Family Medicine

## 2019-04-15 DIAGNOSIS — Z01818 Encounter for other preprocedural examination: Secondary | ICD-10-CM | POA: Insufficient documentation

## 2019-04-15 LAB — CBC WITH DIFFERENTIAL/PLATELET
Basophils Absolute: 0 10*3/uL (ref 0.0–0.2)
Basos: 0 %
EOS (ABSOLUTE): 0.1 10*3/uL (ref 0.0–0.4)
Eos: 1 %
Hematocrit: 40.6 % (ref 34.0–46.6)
Hemoglobin: 13.8 g/dL (ref 11.1–15.9)
Immature Grans (Abs): 0 10*3/uL (ref 0.0–0.1)
Immature Granulocytes: 0 %
Lymphocytes Absolute: 2.9 10*3/uL (ref 0.7–3.1)
Lymphs: 33 %
MCH: 29.3 pg (ref 26.6–33.0)
MCHC: 34 g/dL (ref 31.5–35.7)
MCV: 86 fL (ref 79–97)
Monocytes Absolute: 0.8 10*3/uL (ref 0.1–0.9)
Monocytes: 8 %
Neutrophils Absolute: 5.1 10*3/uL (ref 1.4–7.0)
Neutrophils: 58 %
Platelets: 266 10*3/uL (ref 150–450)
RBC: 4.71 x10E6/uL (ref 3.77–5.28)
RDW: 12.6 % (ref 11.7–15.4)
WBC: 8.9 10*3/uL (ref 3.4–10.8)

## 2019-04-15 LAB — COMPREHENSIVE METABOLIC PANEL
ALT: 19 IU/L (ref 0–32)
AST: 17 IU/L (ref 0–40)
Albumin/Globulin Ratio: 1.6 (ref 1.2–2.2)
Albumin: 4.3 g/dL (ref 3.9–5.0)
Alkaline Phosphatase: 102 IU/L (ref 39–117)
BUN/Creatinine Ratio: 13 (ref 9–23)
BUN: 12 mg/dL (ref 6–20)
Bilirubin Total: 0.2 mg/dL (ref 0.0–1.2)
CO2: 23 mmol/L (ref 20–29)
Calcium: 9.7 mg/dL (ref 8.7–10.2)
Chloride: 102 mmol/L (ref 96–106)
Creatinine, Ser: 0.92 mg/dL (ref 0.57–1.00)
GFR calc Af Amer: 103 mL/min/{1.73_m2} (ref >59)
GFR calc non Af Amer: 89 mL/min/{1.73_m2} (ref >59)
Globulin, Total: 2.7 g/dL (ref 1.5–4.5)
Glucose: 80 mg/dL (ref 65–99)
Potassium: 4.1 mmol/L (ref 3.5–5.2)
Sodium: 139 mmol/L (ref 134–144)
Total Protein: 7 g/dL (ref 6.0–8.5)

## 2019-04-15 LAB — PROTIME-INR
INR: 1 (ref 0.9–1.2)
Prothrombin Time: 10.7 s (ref 9.1–12.0)

## 2019-04-15 LAB — HIV ANTIBODY (ROUTINE TESTING W REFLEX): HIV Screen 4th Generation wRfx: NONREACTIVE

## 2019-04-15 LAB — HEPATITIS B SURFACE ANTIBODY,QUALITATIVE: Hep B Surface Ab, Qual: REACTIVE

## 2019-04-15 LAB — APTT: aPTT: 26 s (ref 24–33)

## 2019-04-15 LAB — RPR: RPR Ser Ql: NONREACTIVE

## 2019-04-15 LAB — BETA HCG QUANT (REF LAB): hCG Quant: <1 m[IU]/mL

## 2019-04-15 NOTE — Progress Notes (Signed)
    SUBJECTIVE:   CHIEF COMPLAINT / HPI:   Patient presents with request for labs.  She is planning to get [place]  She has a list of labs with her.  Is requested that she get before she travels for surgery.  Unfortunately the list is in Spanish and only part of it can be made out by the team to determine which labs she needs.  These labs were ordered as requested.  PERTINENT  PMH / PSH:  Noncontributory  OBJECTIVE:   BP 122/74   Pulse 92   Ht 5\' 6"  (1.676 m)   Wt 199 lb (90.3 kg)   LMP 03/25/2019   SpO2 96%   BMI 32.12 kg/m   Gen: NAD, resting comfortably CV: RRR with no murmurs appreciated Pulm: NWOB,  GI: Normal bowel sounds present. Soft, Nontender, Nondistended. MSK: no edema, cyanosis, or clubbing noted Skin: warm, dry Neuro: grossly normal, moves all extremities Psych: Normal affect and thought content   ASSESSMENT/PLAN:   No problem-specific Assessment & Plan notes found for this encounter.     Bonnita Hollow, MD [place]

## 2019-04-15 NOTE — Assessment & Plan Note (Signed)
Patient presents for surgical preoperative clearance.  Labs ordered as requested please see order list.  Patient is low risk for any surgery.  Although cautioned against getting surgery outside the country as we cannot verify the safety of this.

## 2019-05-17 DIAGNOSIS — H6983 Other specified disorders of Eustachian tube, bilateral: Secondary | ICD-10-CM | POA: Diagnosis not present

## 2019-05-19 ENCOUNTER — Encounter: Payer: Self-pay | Admitting: Family Medicine

## 2019-05-19 ENCOUNTER — Other Ambulatory Visit: Payer: Self-pay

## 2019-05-19 ENCOUNTER — Other Ambulatory Visit (HOSPITAL_COMMUNITY)
Admission: RE | Admit: 2019-05-19 | Discharge: 2019-05-19 | Disposition: A | Discharge status: Home / Self Care | Payer: Medicaid Other | Source: Ambulatory Visit | Attending: Family Medicine | Admitting: Family Medicine

## 2019-05-19 ENCOUNTER — Ambulatory Visit (INDEPENDENT_AMBULATORY_CARE_PROVIDER_SITE_OTHER): Payer: Medicaid Other | Admitting: Family Medicine

## 2019-05-19 VITALS — BP 106/60 | HR 97 | Ht 66.0 in | Wt 195.1 lb

## 2019-05-19 DIAGNOSIS — R8290 Unspecified abnormal findings in urine: Secondary | ICD-10-CM | POA: Diagnosis not present

## 2019-05-19 DIAGNOSIS — B9689 Other specified bacterial agents as the cause of diseases classified elsewhere: Secondary | ICD-10-CM

## 2019-05-19 DIAGNOSIS — Z23 Encounter for immunization: Secondary | ICD-10-CM

## 2019-05-19 DIAGNOSIS — R399 Unspecified symptoms and signs involving the genitourinary system: Secondary | ICD-10-CM | POA: Diagnosis not present

## 2019-05-19 DIAGNOSIS — N760 Acute vaginitis: Secondary | ICD-10-CM | POA: Diagnosis not present

## 2019-05-19 LAB — POCT URINALYSIS DIP (MANUAL ENTRY)
Blood, UA: NEGATIVE
Glucose, UA: NEGATIVE mg/dL
Leukocytes, UA: NEGATIVE
Nitrite, UA: NEGATIVE
Protein Ur, POC: NEGATIVE mg/dL
Spec Grav, UA: >=1.03 — AB (ref 1.010–1.025)
Urobilinogen, UA: 0.2 E.U./dL
pH, UA: 6 (ref 5.0–8.0)

## 2019-05-19 LAB — POCT WET PREP (WET MOUNT)
Clue Cells Wet Prep Whiff POC: POSITIVE
Trichomonas Wet Prep HPF POC: ABSENT

## 2019-05-19 MED ORDER — METRONIDAZOLE 1.3 % VA GEL: 1.0000 "application " | Freq: Two times a day (BID) | VAGINAL | 3 refills | Status: AC

## 2019-05-19 NOTE — Patient Instructions (Signed)
Bacterial Vaginosis  Bacterial vaginosis is a vaginal infection that occurs when the normal balance of bacteria in the vagina is disrupted. It results from an overgrowth of certain bacteria. This is the most common vaginal infection among women ages 15-44. Because bacterial vaginosis increases your risk for STIs (sexually transmitted infections), getting treated can help reduce your risk for chlamydia, gonorrhea, herpes, and HIV (human immunodeficiency virus). Treatment is also important for preventing complications in pregnant women, because this condition can cause an early (premature) delivery. What are the causes? This condition is caused by an increase in harmful bacteria that are normally present in small amounts in the vagina. However, the reason that the condition develops is not fully understood. What increases the risk? The following factors may make you more likely to develop this condition:  Having a new sexual partner or multiple sexual partners.  Having unprotected sex.  Douching.  Having an intrauterine device (IUD).  Smoking.  Drug and alcohol abuse.  Taking certain antibiotic medicines.  Being pregnant. You cannot get bacterial vaginosis from toilet seats, bedding, swimming pools, or contact with objects around you. What are the signs or symptoms? Symptoms of this condition include:  Grey or white vaginal discharge. The discharge can also be watery or foamy.  A fish-like odor with discharge, especially after sexual intercourse or during menstruation.  Itching in and around the vagina.  Burning or pain with urination. Some women with bacterial vaginosis have no signs or symptoms. How is this diagnosed? This condition is diagnosed based on:  Your medical history.  A physical exam of the vagina.  Testing a sample of vaginal fluid under a microscope to look for a large amount of bad bacteria or abnormal cells. Your health care provider may use a cotton swab or  a small wooden spatula to collect the sample. How is this treated? This condition is treated with antibiotics. These may be given as a pill, a vaginal cream, or a medicine that is put into the vagina (suppository). If the condition comes back after treatment, a second round of antibiotics may be needed. Follow these instructions at home: Medicines  Take over-the-counter and prescription medicines only as told by your health care provider.  Take or use your antibiotic as told by your health care provider. Do not stop taking or using the antibiotic even if you start to feel better. General instructions  If you have a female sexual partner, tell her that you have a vaginal infection. She should see her health care provider and be treated if she has symptoms. If you have a female sexual partner, he does not need treatment.  During treatment: ? Avoid sexual activity until you finish treatment. ? Do not douche. ? Avoid alcohol as directed by your health care provider. ? Avoid breastfeeding as directed by your health care provider.  Drink enough water and fluids to keep your urine clear or pale yellow.  Keep the area around your vagina and rectum clean. ? Wash the area daily with warm water. ? Wipe yourself from front to back after using the toilet.  Keep all follow-up visits as told by your health care provider. This is important. How is this prevented?  Do not douche.  Wash the outside of your vagina with warm water only.  Use protection when having sex. This includes latex condoms and dental dams.  Limit how many sexual partners you have. To help prevent bacterial vaginosis, it is best to have sex with just one partner (  monogamous).  Make sure you and your sexual partner are tested for STIs.  Wear cotton or cotton-lined underwear.  Avoid wearing tight pants and pantyhose, especially during summer.  Limit the amount of alcohol that you drink.  Do not use any products that contain  nicotine or tobacco, such as cigarettes and e-cigarettes. If you need help quitting, ask your health care provider.  Do not use illegal drugs. Where to find more information  Centers for Disease Control and Prevention: www.cdc.gov/std  American Sexual Health Association (ASHA): www.ashastd.org  U.S. Department of Health and Human Services, Office on Women's Health: www.womenshealth.gov/ or https://www.womenshealth.gov/a-z-topics/bacterial-vaginosis Contact a health care provider if:  Your symptoms do not improve, even after treatment.  You have more discharge or pain when urinating.  You have a fever.  You have pain in your abdomen.  You have pain during sex.  You have vaginal bleeding between periods. Summary  Bacterial vaginosis is a vaginal infection that occurs when the normal balance of bacteria in the vagina is disrupted.  Because bacterial vaginosis increases your risk for STIs (sexually transmitted infections), getting treated can help reduce your risk for chlamydia, gonorrhea, herpes, and HIV (human immunodeficiency virus). Treatment is also important for preventing complications in pregnant women, because the condition can cause an early (premature) delivery.  This condition is treated with antibiotic medicines. These may be given as a pill, a vaginal cream, or a medicine that is put into the vagina (suppository). This information is not intended to replace advice given to you by your health care provider. Make sure you discuss any questions you have with your health care provider. Document Revised: 01/09/2017 Document Reviewed: 10/13/2015 Elsevier Patient Education  2020 Elsevier Inc.  

## 2019-05-19 NOTE — Progress Notes (Signed)
   Antony Haste is alone Sources of clinical information for visit is/are patient. Nursing assessment for this office visit was reviewed with the patient for accuracy and revision.   Previous Report(s) Reviewed: none  Depression screen PHQ 2/9 05/19/2019  Decreased Interest 1  Down, Depressed, Hopeless 0  PHQ - 2 Score 1  Altered sleeping -  Tired, decreased energy -  Change in appetite -  Feeling bad or failure about yourself  -  Trouble concentrating -  Moving slowly or fidgety/restless -  Suicidal thoughts -  PHQ-9 Score -  Difficult doing work/chores -    Fall Risk  05/19/2019 02/28/2019 11/12/2018 03/23/2018 02/18/2018  Falls in the past year? 0 0 0 0 0  Number falls in past yr: 0 0 - - -  Injury with Fall? 0 - - - -  Follow up - Falls evaluation completed - - -    Adult vaccines due  Topic Date Due  . TETANUS/TDAP  Never done    Health Maintenance Due  Topic Date Due  . TETANUS/TDAP  Never done      History/P.E. limitations: none  Adult vaccines due  Topic Date Due  . TETANUS/TDAP  Never done   There are no preventive care reminders to display for this patient.  Health Maintenance Due  Topic Date Due  . TETANUS/TDAP  Never done     Chief Complaint  Patient presents with  . Vaginal Discharge     SUBJECTIVE:   CHIEF COMPLAINT / HPI:   Abnormal Urinalysis  Patient complains of an abnormal urinalysis in preop for cosmetic surgery in [facility].  UA showed (+) LE, (-) Nit, significant number epi squames, 0-5 WBC, 0-5 RBC She has had no bladdr nor kidney symptoms.  VAGINAL DISCHARGE  Onset: uncertain  Description: thin Itching: no  Symptoms Dysuria: no  Bleeding: no  Pelvic pain: no  Back pain: no  Fever: no  Genital sores: no  Rash: no  Dyspareunia: no  GI Sxs: no  Prior treatment: Has been treated for BV previously  Red Flags: Missed period: no  Pregnancy: no  Recent antibiotics: no  Sexual activity: yes  Possible STD exposure:  presumed female partner monogamy       PERTINENT  PMH / [id number] Pap 02/28/19 satisfactory and negative of intraepithelieal abnromalities nor malignancies Recent negative tests for RPR and HIV 04/14/19  OBJECTIVE:   BP 106/60   Pulse 97   Ht 5\' 6"  (1.676 m)   Wt 195 lb 2 oz (88.5 kg)   LMP 04/30/2019   SpO2 98%   BMI 31.49 kg/m   Pelvic exam: normal external genitalia, vulva, vagina, cervix, uterus and adnexa, VAGINA: normal appearing vagina with normal color and discharge, no lesions, vaginal discharge - clear, odorless and thin, CERVIX: normal appearing cervix without discharge or lesions, WET MOUNT done - results: DNA probe for chlamydia and GC obtained, (+) clues .  Urinalysis without evidence of infection  ASSESSMENT/PLAN:   No problem-specific Assessment & Plan notes found for this encounter.     Lissa Morales, MD [place]

## 2019-05-20 LAB — CERVICOVAGINAL ANCILLARY ONLY
Chlamydia: NEGATIVE
Comment: NEGATIVE
Comment: NORMAL
Neisseria Gonorrhea: NEGATIVE

## 2019-05-23 ENCOUNTER — Encounter: Payer: Self-pay | Admitting: Family Medicine

## 2019-05-23 DIAGNOSIS — B9689 Other specified bacterial agents as the cause of diseases classified elsewhere: Secondary | ICD-10-CM | POA: Insufficient documentation

## 2019-05-23 DIAGNOSIS — R8290 Unspecified abnormal findings in urine: Secondary | ICD-10-CM | POA: Insufficient documentation

## 2019-05-23 NOTE — Assessment & Plan Note (Signed)
Screening preop urinalysis with (+) LE without sign WBC on microscopy. No symptoms fo UTI This visit UA without evidence of infection.  Reassurance given. No further workup planned

## 2019-05-23 NOTE — Assessment & Plan Note (Signed)
Recurrence Flagyl gel bid x 7 days.  FU if not improved

## 2019-07-04 DIAGNOSIS — R8281 Pyuria: Secondary | ICD-10-CM | POA: Diagnosis not present

## 2019-07-04 DIAGNOSIS — K529 Noninfective gastroenteritis and colitis, unspecified: Secondary | ICD-10-CM | POA: Diagnosis not present

## 2019-07-04 DIAGNOSIS — N760 Acute vaginitis: Secondary | ICD-10-CM | POA: Diagnosis not present

## 2019-07-04 DIAGNOSIS — R109 Unspecified abdominal pain: Secondary | ICD-10-CM | POA: Diagnosis not present

## 2019-07-07 ENCOUNTER — Ambulatory Visit: Payer: Medicaid Other

## 2019-07-12 ENCOUNTER — Ambulatory Visit: Payer: Medicaid Other | Admitting: Family Medicine

## 2019-07-19 ENCOUNTER — Other Ambulatory Visit: Payer: Self-pay

## 2019-07-19 ENCOUNTER — Ambulatory Visit (INDEPENDENT_AMBULATORY_CARE_PROVIDER_SITE_OTHER): Payer: Medicaid Other | Admitting: Family Medicine

## 2019-07-19 VITALS — BP 102/62 | HR 113 | Ht 66.0 in | Wt 187.2 lb

## 2019-07-19 DIAGNOSIS — R21 Rash and other nonspecific skin eruption: Secondary | ICD-10-CM | POA: Diagnosis not present

## 2019-07-19 DIAGNOSIS — L742 Miliaria profunda: Secondary | ICD-10-CM

## 2019-07-19 MED ORDER — HYDROCORTISONE 2.5 % EX OINT: TOPICAL_OINTMENT | Freq: Two times a day (BID) | CUTANEOUS | 3 refills | Status: DC

## 2019-07-19 MED ORDER — CETIRIZINE HCL 10 MG PO TABS: 10.0000 mg | ORAL_TABLET | Freq: Every day | ORAL | 11 refills | Status: DC

## 2019-07-19 NOTE — Assessment & Plan Note (Signed)
Possibly malaria profunda sweating although does not explain the itching.  Could also be contact dermatitis from the compression band that she wears.  Does not appear to be insect bites. -Keep area dry -Avoid wearing the compression garment -Hydrocortisone and Xyzal for itching -Follow-up as needed

## 2019-07-19 NOTE — Progress Notes (Signed)
    SUBJECTIVE:   CHIEF COMPLAINT / HPI:   Rash on back and stomach Duration 1-2 weeks.  It is itchy.  It is composed of small red bumps.  It is not getting worse.  He has been using an OTC lotion and cortisone 1%, both helped. Bumps are still present.  Denies fevers or chills. Denies exposure to new animals, soaps. Denies being outside for insect bites.  Denies sores in mouth or elsewhere.  Denies new medication.  The bumps are not painful.  She thinks its sweat bumps.  She has been a body compression gaurment with foam around her trunk.  She feels that the material is irritating, especially when she sweats.  OBJECTIVE:   BP 102/62 Comment: provider informed  Pulse (!) 113 Comment: provider informed  Ht 5\' 6"  (1.676 m)   Wt 187 lb 4 oz (84.9 kg)   LMP 06/11/2019   SpO2 98%   BMI 30.22 kg/m   Skin: Several small erythematous papules scattered across the back and abdomen no distinct clustering delineation, nontender, no drainage     ASSESSMENT/PLAN:   Rash and nonspecific skin eruption Possibly malaria profunda sweating although does not explain the itching.  Could also be contact dermatitis from the compression band that she wears.  Does not appear to be insect bites. -Keep area dry -Avoid wearing the compression garment -Hydrocortisone and Xyzal for itching -Follow-up as needed     Bonnita Hollow, MD [place]

## 2019-07-19 NOTE — Progress Notes (Signed)
A 

## 2019-07-19 NOTE — Patient Instructions (Signed)
Rash, Adult  A rash is a change in the color of your skin. A rash can also change the way your skin feels. There are many different conditions and factors that can cause a rash. Follow these instructions at home: The goal of treatment is to stop the itching and keep the rash from spreading. Watch for any changes in your symptoms. Let your doctor know about them. Follow these instructions to help with your condition: Medicine Take or apply over-the-counter and prescription medicines only as told by your doctor. These may include medicines:  To treat red or swollen skin (corticosteroid creams).  To treat itching.  To treat an allergy (oral antihistamines).  To treat very bad symptoms (oral corticosteroids).  Skin care  Put cool cloths (compresses) on the affected areas.  Do not scratch or rub your skin.  Avoid covering the rash. Make sure that the rash is exposed to air as much as possible. Managing itching and discomfort  Avoid hot showers or baths. These can make itching worse. A cold shower may help.  Try taking a bath with: ? Epsom salts. You can get these at your local pharmacy or grocery store. Follow the instructions on the package. ? Baking soda. Pour a small amount into the bath as told by your doctor. ? Colloidal oatmeal. You can get this at your local pharmacy or grocery store. Follow the instructions on the package.  Try putting baking soda paste onto your skin. Stir water into baking soda until it gets like a paste.  Try putting on a lotion that relieves itchiness (calamine lotion).  Keep cool and out of the sun. Sweating and being hot can make itching worse. General instructions   Rest as needed.  Drink enough fluid to keep your pee (urine) pale yellow.  Wear loose-fitting clothing.  Avoid scented soaps, detergents, and perfumes. Use gentle soaps, detergents, perfumes, and other cosmetic products.  Avoid anything that causes your rash. Keep a journal to  help track what causes your rash. Write down: ? What you eat. ? What cosmetic products you use. ? What you drink. ? What you wear. This includes jewelry.  Keep all follow-up visits as told by your doctor. This is important. Contact a doctor if:  You sweat at night.  You lose weight.  You pee (urinate) more than normal.  You pee less than normal, or you notice that your pee is a darker color than normal.  You feel weak.  You throw up (vomit).  Your skin or the whites of your eyes look yellow (jaundice).  Your skin: ? Tingles. ? Is numb.  Your rash: ? Does not go away after a few days. ? Gets worse.  You are: ? More thirsty than normal. ? More tired than normal.  You have: ? New symptoms. ? Pain in your belly (abdomen). ? A fever. ? Watery poop (diarrhea). Get help right away if:  You have a fever and your symptoms suddenly get worse.  You start to feel mixed up (confused).  You have a very bad headache or a stiff neck.  You have very bad joint pains or stiffness.  You have jerky movements that you cannot control (seizure).  Your rash covers all or most of your body. The rash may or may not be painful.  You have blisters that: ? Are on top of the rash. ? Grow larger. ? Grow together. ? Are painful. ? Are inside your nose or mouth.  You have a rash   that: ? Looks like purple pinprick-sized spots all over your body. ? Has a "bull's eye" or looks like a target. ? Is red and painful, causes your skin to peel, and is not from being in the sun too long. Summary  A rash is a change in the color of your skin. A rash can also change the way your skin feels.  The goal of treatment is to stop the itching and keep the rash from spreading.  Take or apply over-the-counter and prescription medicines only as told by your doctor.  Contact a doctor if you have new symptoms or symptoms that get worse.  Keep all follow-up visits as told by your doctor. This is  important. This information is not intended to replace advice given to you by your health care provider. Make sure you discuss any questions you have with your health care provider. Document Revised: 05/21/2018 Document Reviewed: 08/31/2017 Elsevier Patient Education  2020 Elsevier Inc.  

## 2019-08-01 ENCOUNTER — Telehealth: Payer: Self-pay | Admitting: Family Medicine

## 2019-08-01 NOTE — Telephone Encounter (Signed)
Patient calling for a referral to [facility]. She has a mole on her head that needs to be froze off and was told by doctor at our office that we cannot freeze these off. Please let patient know when this referral has been placed.

## 2019-08-02 ENCOUNTER — Other Ambulatory Visit: Payer: Self-pay | Admitting: Family Medicine

## 2019-08-02 DIAGNOSIS — D229 Melanocytic nevi, unspecified: Secondary | ICD-10-CM

## 2019-08-02 NOTE — Telephone Encounter (Signed)
Referral placed to [facility].

## 2019-08-04 ENCOUNTER — Ambulatory Visit: Payer: Medicaid Other | Admitting: Obstetrics and Gynecology

## 2019-08-08 DIAGNOSIS — Z113 Encounter for screening for infections with a predominantly sexual mode of transmission: Secondary | ICD-10-CM | POA: Diagnosis not present

## 2019-08-08 DIAGNOSIS — R399 Unspecified symptoms and signs involving the genitourinary system: Secondary | ICD-10-CM | POA: Diagnosis not present

## 2019-08-08 DIAGNOSIS — H6123 Impacted cerumen, bilateral: Secondary | ICD-10-CM | POA: Diagnosis not present

## 2019-08-09 ENCOUNTER — Telehealth: Payer: Self-pay | Admitting: General Practice

## 2019-08-09 NOTE — Telephone Encounter (Signed)
Left message on VM informing patient of appt rescheduled to 08/18/2019 at 2:50pm due to provider will not be available on 08/17/2019 in the PM.  Asked patient to give our office a call.

## 2019-08-12 ENCOUNTER — Ambulatory Visit: Payer: Medicaid Other | Admitting: Obstetrics and Gynecology

## 2019-08-17 ENCOUNTER — Ambulatory Visit: Payer: Medicaid Other | Admitting: Certified Nurse Midwife

## 2019-08-18 ENCOUNTER — Ambulatory Visit: Payer: Medicaid Other | Admitting: Obstetrics and Gynecology

## 2019-09-08 ENCOUNTER — Other Ambulatory Visit: Payer: Self-pay

## 2019-09-08 ENCOUNTER — Ambulatory Visit (INDEPENDENT_AMBULATORY_CARE_PROVIDER_SITE_OTHER): Payer: Medicaid Other | Admitting: Family Medicine

## 2019-09-08 VITALS — BP 110/80 | HR 93 | Ht 66.0 in | Wt 186.2 lb

## 2019-09-08 DIAGNOSIS — D229 Melanocytic nevi, unspecified: Secondary | ICD-10-CM

## 2019-09-08 NOTE — Assessment & Plan Note (Signed)
Likely benign nevus of scalp given appearance, history and hair follicle.  Less likely malignant or infectious process.  Performed shave biopsy of lesion. - Biopsy and sending to lab and, will inform of the results.   - Keep the Bandaid on and avoid showering for 24 hours, small amount of bleeding is normal - Return to clinic if significant pain or bleeding or develop infection

## 2019-09-08 NOTE — Progress Notes (Addendum)
    SUBJECTIVE:   CHIEF COMPLAINT / HPI: Scalp lesion  Leah [name]  Is a 19 yo female presenting with lesion on scalp.  Indicates it is not pruritic or painful.  Has been present for several years and has not been enlarging fast.  Denis any systemic Does not irritate patient, but she would like to have it removed for cosmetic purposes.  PERTINENT  PMH / PSH: N/A  OBJECTIVE:   BP 110/80   Pulse 93   Ht 5\' 6"  (1.676 m)   Wt 186 lb 3.2 oz (84.5 kg)   SpO2 99%   BMI 30.05 kg/m    Physical Exam Constitutional:      General: She is not in acute distress.    Appearance: Normal appearance.  Skin:    General: Skin is warm.     Findings: Lesion present. No bruising, erythema or rash.     Comments: 0.5 flesh colored, non-tender stuck on raised papule on frontal scalp, contains hair follicle  Neurological:     Mental Status: She is alert.  Psychiatric:        Mood and Affect: Mood normal.     Procedure: Shave Biopsy  Shave After informed written consent was obtained, using alcohol and Betadine for cleansing and 1% Lidocaine with epinephrine for anesthetic, with sterile technique a dermablade was used to obtain a biopsy specimen of the lesion. Hemostasis was obtained by silver nitrate.  Antibiotic dressing is applied, and wound care instructions provided. Be alert for any signs of cutaneous infection. The specimen is labeled and sent to pathology for evaluation. The procedure was well tolerated without complications.   ASSESSMENT/PLAN:     Benign skin mole Likely benign nevus of scalp given appearance, history and hair follicle.  Less likely malignant or infectious process.  Performed shave biopsy of lesion. - Biopsy and sending to lab and, will inform of the results.   - Keep the Bandaid on and avoid showering for 24 hours, small amount of bleeding is normal - Return to clinic if significant pain or bleeding or develop infection     Delora Fuel, MD [place]   I was present during the examination and procedure and agree with above documentation Lind Covert

## 2019-09-08 NOTE — Patient Instructions (Signed)
It was good to see you today.  Thank you for coming in.  We removed what we believe is a benign mole today.  We are sending it to the lab and will let you know of the resultys.  If it comes back as anything abnormal we will call you.  If not we will send you a message in [place].  Keep the Bandaid on and avoid showering for 24 hours.  You may have a small amount of bleeding.  If it becomes very painful or looks infected come back and see [place].   Be Well, Dr Delora Fuel

## 2019-09-13 DIAGNOSIS — Z20822 Contact with and (suspected) exposure to covid-19: Secondary | ICD-10-CM | POA: Diagnosis not present

## 2019-10-02 IMAGING — US US TRANSVAGINAL NON-OB
1 series · 15 of 25 positions shown · non-contrast
Comparison: None.

CLINICAL DATA: 18-year-old female with right lower quadrant pain.
Chlamydia infection x2. LMP 03/15/2018.

EXAM:
ULTRASOUND PELVIS TRANSVAGINAL
TECHNIQUE: Transvaginal ultrasound examination of the pelvis was performed
including evaluation of the uterus, ovaries, adnexal regions, and
pelvic cul-de-sac.

[Series 1: us transvaginal non-ob · 36 acquisitions, 15 frames shown]
[im 1/36]
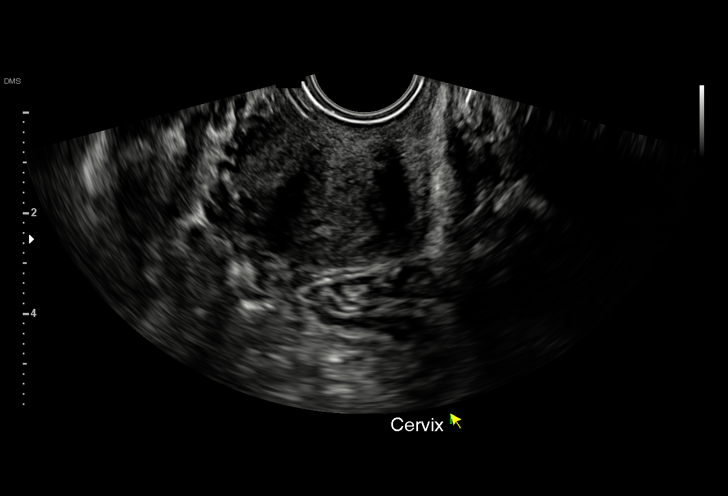
[im 3/36]
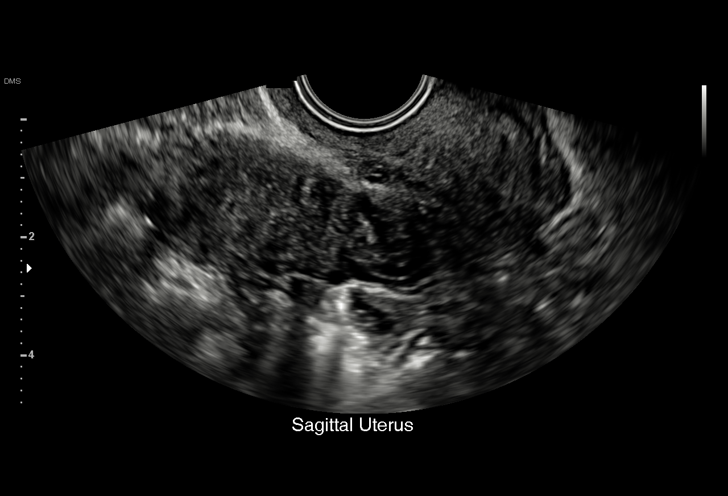
[im 6/36]
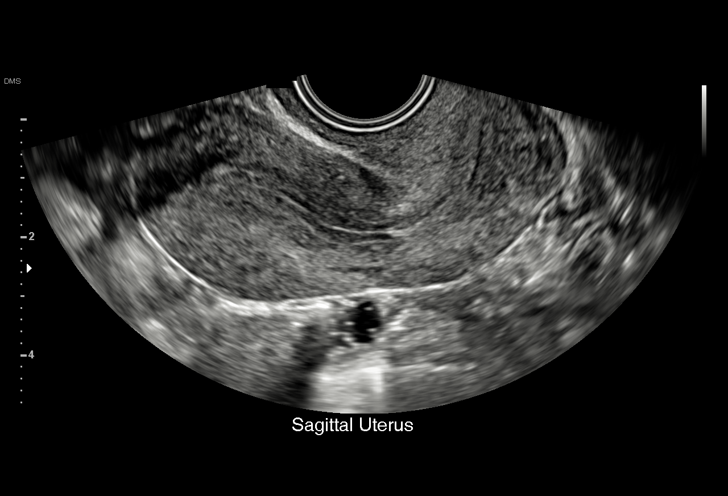
[im 8/36]
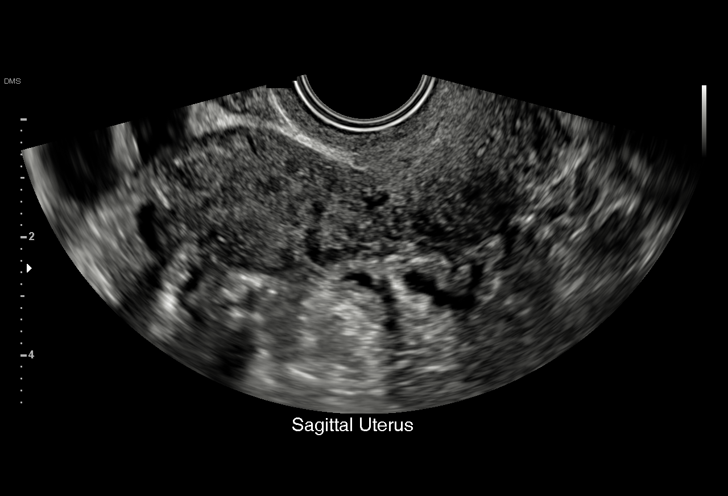
[im 11/36]
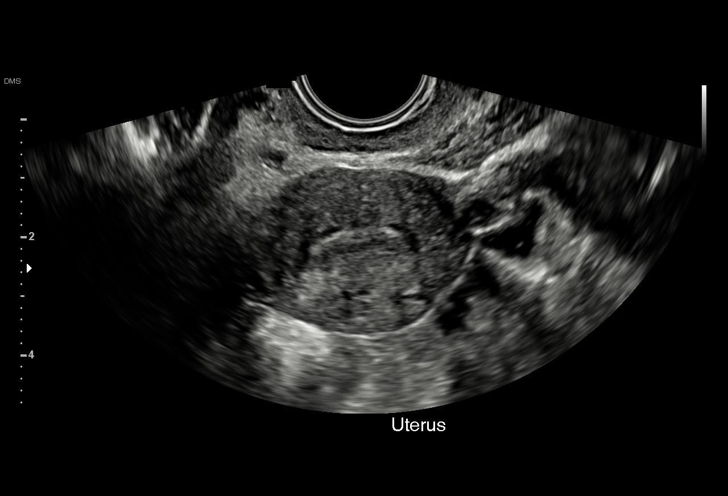
[im 14/36]
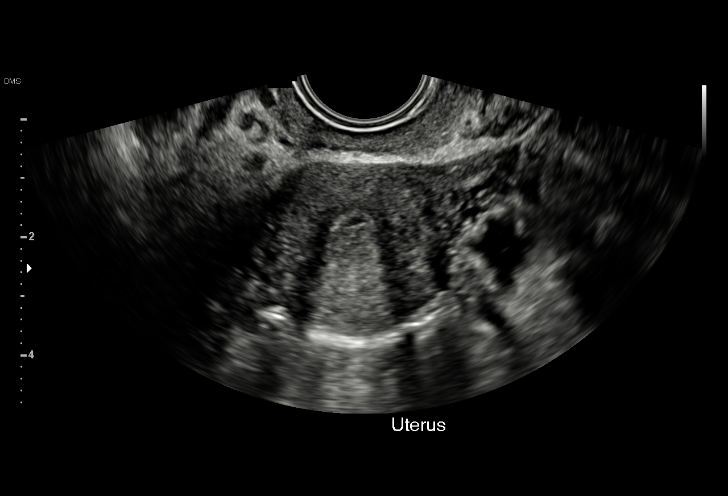
[im 15/36]
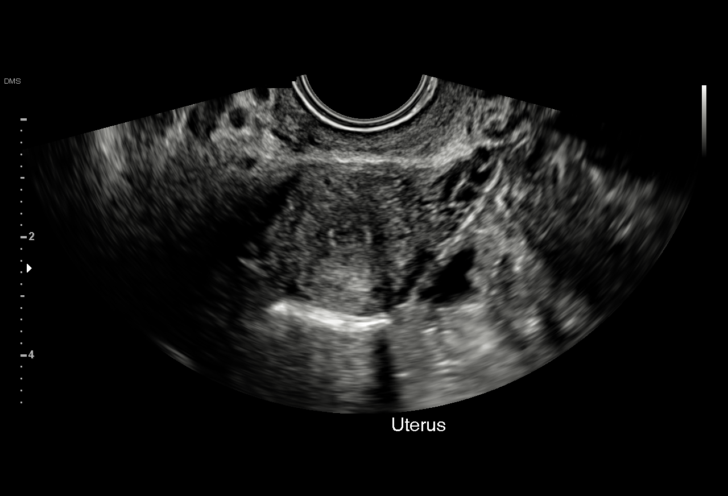
[im 18/36]
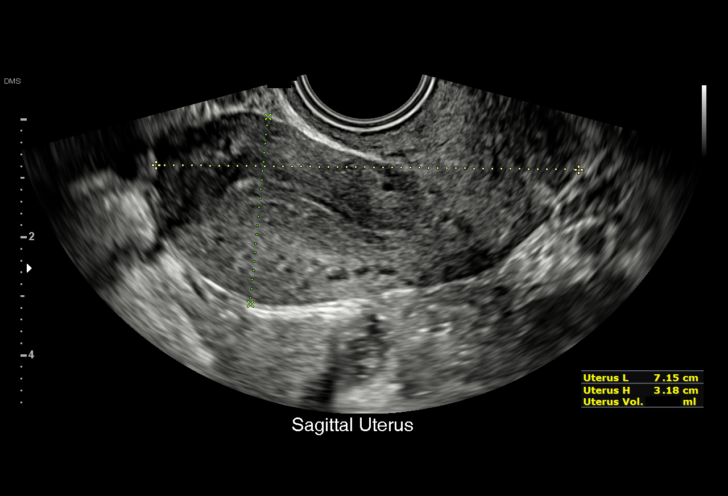
[im 21/36]
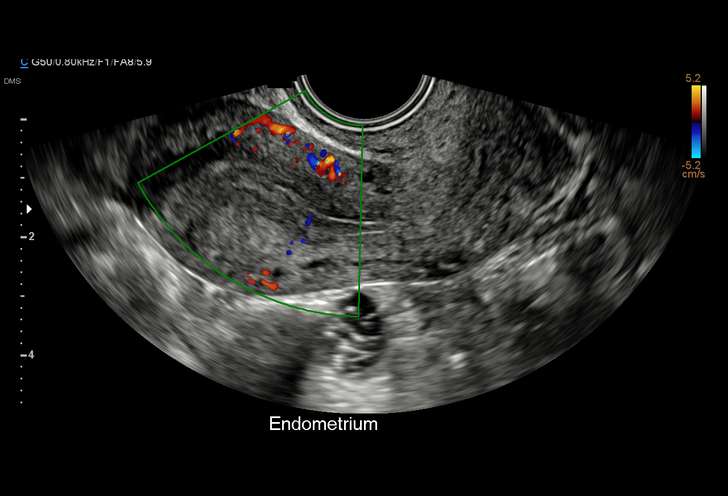
[im 22/36]
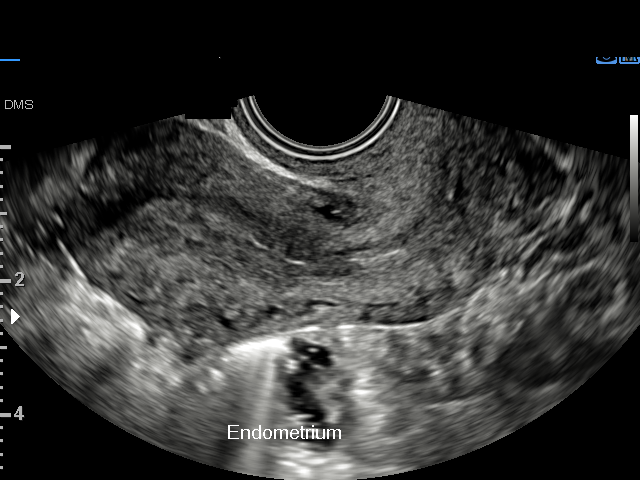
[im 25/36]
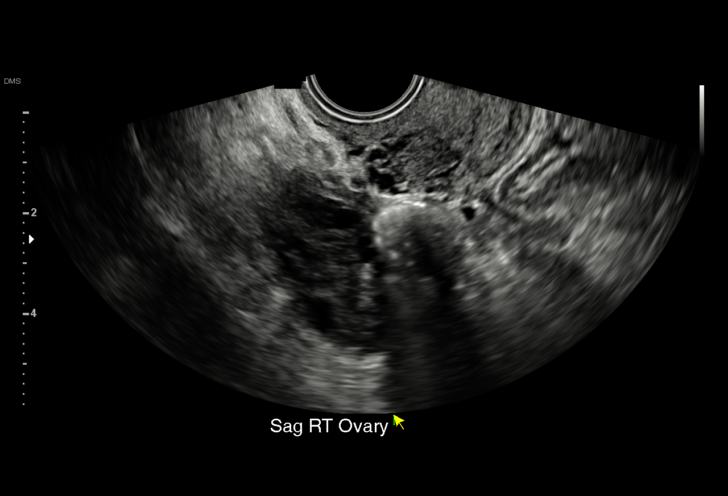
[im 28/36]
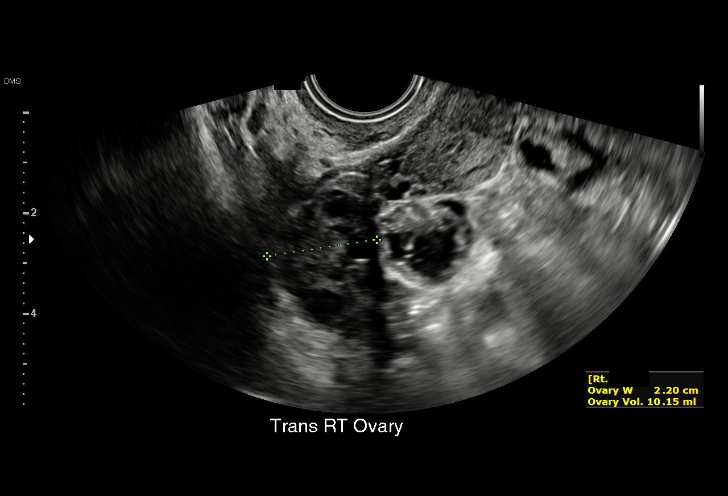
[im 30/36]
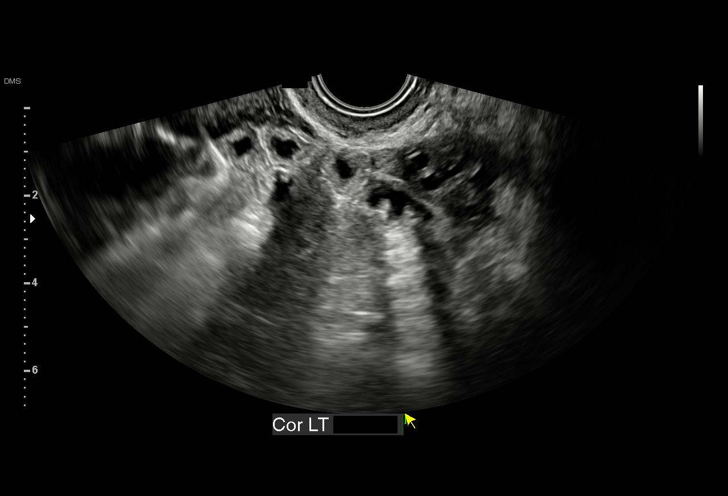
[im 33/36]
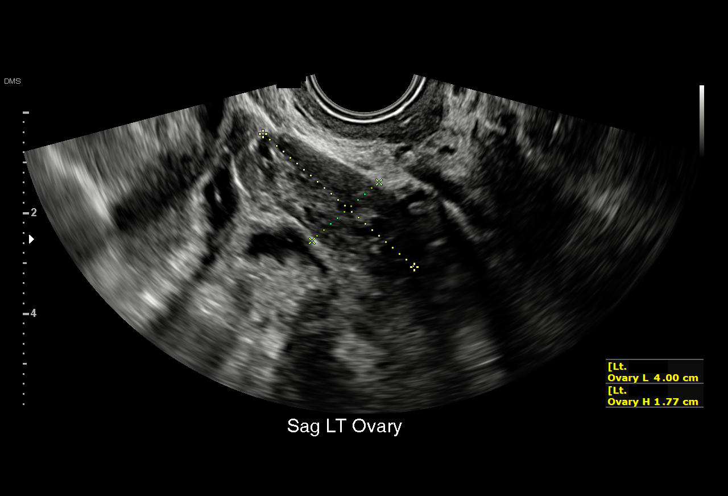
[im 36/36]
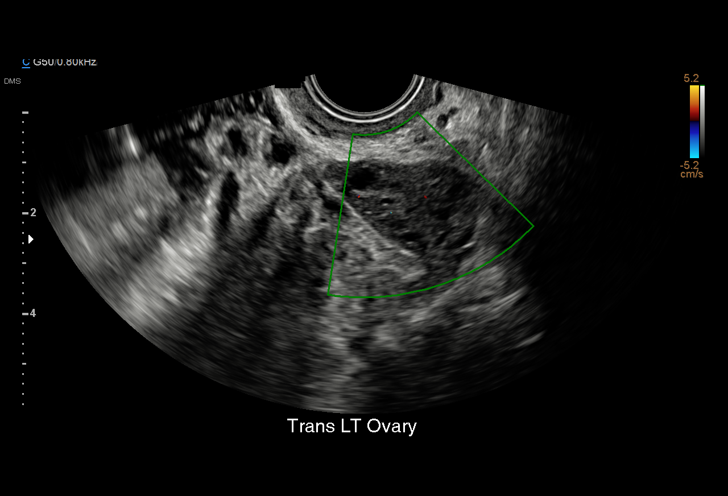

[15 of 25 positions shown; findings below may reference images not displayed]

FINDINGS: Uterus

Measurements: 7.2 x 3.2 x 4.0 cm = volume: 48 mL. Anteverted uterus
is normal in size and configuration, with no uterine fibroids or
other myometrial abnormalities.

Endometrium

Thickness: 5 mm. No endometrial cavity fluid or focal endometrial
mass.

Right ovary

Measurements: 3.9 x 2.2 x 2.2 cm = volume: 10 mL. Normal
appearance/no adnexal mass.

Left ovary

Measurements: 4.0 x 1.8 x 2.8 cm = volume: 10 mL. Normal
appearance/no adnexal mass.

Other findings:  No abnormal free fluid
IMPRESSION: Normal pelvic sonogram.  No ovarian or adnexal abnormality.

## 2019-10-11 ENCOUNTER — Ambulatory Visit: Payer: Medicaid Other

## 2019-10-11 NOTE — Progress Notes (Deleted)
    SUBJECTIVE:   CHIEF COMPLAINT / HPI:   Discharge:   PERTINENT  PMH / PSH: ***  OBJECTIVE:   There were no vitals taken for this visit.  ***  ASSESSMENT/PLAN:   No problem-specific Assessment & Plan notes found for this encounter.     Benay Pike, MD [place]

## 2019-10-23 NOTE — Progress Notes (Signed)
Patient no showed appt 10/24/2019.  Patient also missed appointment 02/23/2019, 07/07/2019, 07/12/2019, and 10/11/2019.  Patient called at [phone], no answer, left HIPAA compliant voicemail.  Patient also reminded of her appointment on 10/28/2019.  Patient sent a letter regarding her previous no-shows and our no-show policy on 2/82/0813.  Was sent to patient directly via MyChart as well as forwarded to the [facility] admin pool.  Health maintenance: patient due for Hep C Ab screen, Flu vaccine and COVID vaccine  Leah [name], [place]

## 2019-10-24 ENCOUNTER — Ambulatory Visit (INDEPENDENT_AMBULATORY_CARE_PROVIDER_SITE_OTHER): Payer: Medicaid Other | Admitting: Family Medicine

## 2019-10-24 DIAGNOSIS — Z5329 Procedure and treatment not carried out because of patient's decision for other reasons: Secondary | ICD-10-CM

## 2019-10-25 DIAGNOSIS — Z91199 Patient's noncompliance with other medical treatment and regimen due to unspecified reason: Secondary | ICD-10-CM | POA: Insufficient documentation

## 2019-10-28 ENCOUNTER — Ambulatory Visit: Payer: Medicaid Other | Admitting: Family Medicine

## 2019-11-04 DIAGNOSIS — R3915 Urgency of urination: Secondary | ICD-10-CM | POA: Diagnosis not present

## 2019-11-04 DIAGNOSIS — N898 Other specified noninflammatory disorders of vagina: Secondary | ICD-10-CM | POA: Diagnosis not present

## 2019-11-08 NOTE — Progress Notes (Deleted)
    SUBJECTIVE:   CHIEF COMPLAINT / HPI:   ***  PERTINENT  PMH / PSH: ***  OBJECTIVE:   There were no vitals taken for this visit.  ***  ASSESSMENT/PLAN:   No problem-specific Assessment & Plan notes found for this encounter.     Jaslyne Beeck, MD [facility]  

## 2019-11-09 ENCOUNTER — Ambulatory Visit: Payer: Medicaid Other | Admitting: Family Medicine

## 2019-11-10 ENCOUNTER — Telehealth: Payer: Self-pay | Admitting: Family Medicine

## 2019-11-10 ENCOUNTER — Encounter: Payer: Self-pay | Admitting: Family Medicine

## 2019-11-10 NOTE — Telephone Encounter (Signed)
Patient ID: Leah [name], female   DOB: 2000-06-15, 19 y.o.   MRN: [id number] I called her multiple times with no response. Chart reviewed. It looks like she was seen at [facility] on 11/04/19.  Given that she met our no-show dismissal criteria, a dismissal message was sent through [place]. I will also send her a certified letter as well.

## 2019-11-10 NOTE — Telephone Encounter (Signed)
Patient ID: Leah [name], female   DOB: Apr 20, 2000, 19 y.o.   MRN: [id number] Message Received: Today Lurline Del, DO  Kinnie Feil, MD Hey Dr. Gwendlyn Deutscher,   I checked the chart and it appears I have never met this patient. I do not have a problem with dismissal per clinic protocol with her multiple no-shows.   Ryan       Previous Messages   ----- Message -----  From: Kinnie Feil, MD  Sent: 11/10/2019  9:44 AM EDT  To: Daisy Floro, DO, Lurline Del, DO, *   Hello Ryan,   I received multiple no-show report regarding this patient. She met the dismissal criteria. I need to check in with you first to know how you want to proceed with this since you are the PCP. Please reach out to me soon regarding the next step before I contact the patient.  ----- Message -----  From: Carollee Leitz, MD  Sent: 11/09/2019  7:40 PM EDT  To: Kinnie Feil, MD   Hi Dr Gwendlyn Deutscher,   I had received a message from Dr Clayton Bibles to let you know if this patient no showed again. It looks like this has been 7 times now. From my understanding voicemail was left on 09/13 and a letter had been sent to patient on 37/94 on no show policy.    Thanks  Lavella Lemons

## 2019-11-10 NOTE — Progress Notes (Signed)
Patient ID: Leah [name], female   DOB: 10/23/2000, 19 y.o.   MRN: [id number] Message Received: Today Welborn, Ryan, DO  Tavita Eastham T, MD Hey Dr. Brekyn Huntoon,   I checked the chart and it appears I have never met this patient. I do not have a problem with dismissal per clinic protocol with her multiple no-shows.   Ryan       Previous Messages   ----- Message -----  From: Siera Beyersdorf T, MD  Sent: 11/10/2019  9:44 AM EDT  To: Hannah C Anderson, DO, Ryan Welborn, DO, *   Hello Ryan,   I received multiple no-show report regarding this patient. She met the dismissal criteria. I need to check in with you first to know how you want to proceed with this since you are the PCP. Please reach out to me soon regarding the next step before I contact the patient.  ----- Message -----  From: Walsh, Tanya, MD  Sent: 11/09/2019  7:40 PM EDT  To: Delane Stalling T Damesha Lawler, MD   Hi Dr Malory Spurr,   I had received a message from Dr H Anderson to let you know if this patient no showed again. It looks like this has been 7 times now. From my understanding voicemail was left on 09/13 and a letter had been sent to patient on 09/14 on no show policy.    Thanks  Tanya    

## 2019-11-10 NOTE — Progress Notes (Signed)
Patient ID: Leah [name], female   DOB: [birth year], 19 y.o.   MRN: [id number] I called her multiple times with no response. Chart reviewed. It looks like she was seen at [facility] on 11/04/19.  Given that she met our no-show dismissal criteria, a dismissal message was sent through [place]. I will also send her a certified letter as well.

## 2019-11-11 ENCOUNTER — Encounter: Payer: Self-pay | Admitting: Family Medicine

## 2019-12-14 DIAGNOSIS — R350 Frequency of micturition: Secondary | ICD-10-CM | POA: Diagnosis not present

## 2019-12-14 DIAGNOSIS — R339 Retention of urine, unspecified: Secondary | ICD-10-CM | POA: Diagnosis not present

## 2019-12-14 DIAGNOSIS — N898 Other specified noninflammatory disorders of vagina: Secondary | ICD-10-CM | POA: Diagnosis not present

## 2019-12-14 DIAGNOSIS — H6123 Impacted cerumen, bilateral: Secondary | ICD-10-CM | POA: Diagnosis not present

## 2019-12-18 ENCOUNTER — Ambulatory Visit (HOSPITAL_COMMUNITY): Admit: 2019-12-18 | Discharge status: Absent without leave | Payer: Medicaid Other

## 2019-12-18 ENCOUNTER — Encounter (HOSPITAL_COMMUNITY): Payer: Self-pay | Admitting: *Deleted

## 2019-12-18 ENCOUNTER — Other Ambulatory Visit: Payer: Self-pay

## 2019-12-18 ENCOUNTER — Ambulatory Visit (HOSPITAL_COMMUNITY)
Admission: EM | Admit: 2019-12-18 | Discharge: 2019-12-18 | Disposition: A | Discharge status: Home / Self Care | Payer: Medicaid Other | Attending: Emergency Medicine | Admitting: Emergency Medicine

## 2019-12-18 DIAGNOSIS — J069 Acute upper respiratory infection, unspecified: Secondary | ICD-10-CM | POA: Diagnosis not present

## 2019-12-18 DIAGNOSIS — H60503 Unspecified acute noninfective otitis externa, bilateral: Secondary | ICD-10-CM

## 2019-12-18 DIAGNOSIS — H9203 Otalgia, bilateral: Secondary | ICD-10-CM

## 2019-12-18 LAB — POCT RAPID STREP A, ED / UC: Streptococcus, Group A Screen (Direct): NEGATIVE

## 2019-12-18 MED ORDER — NEOMYCIN-POLYMYXIN-HC 3.5-10000-1 OT SUSP
4.0000 [drp] | Freq: Three times a day (TID) | OTIC | 0 refills | Status: DC
Start: 2019-12-18 — End: 2021-02-21

## 2019-12-18 NOTE — ED Triage Notes (Signed)
Pt reports bil ear pain for 3 days with nasal congestion and sore throat. Pt recently went to [facility] to have wax removed from ears then her ears started to hurt the day after wax rempval.

## 2019-12-18 NOTE — Discharge Instructions (Signed)
Use the ear drops as directed.  Follow up with your primary care provider if your symptoms are not improving.    

## 2019-12-18 NOTE — ED Provider Notes (Signed)
[place]    CSN: [id number] Arrival date & time: 12/18/19  1229      History   Chief Complaint Chief Complaint  Patient presents with  . Otalgia    LT/RT    HPI Leah [name] is a 19 y.o. female.   Patient presents with bilateral ear pain x3 days after having her earwax removed at another urgent care.  She also reports 1 day history of sore throat and nonproductive cough.  She denies fever, chills, rash, shortness of breath, vomiting, diarrhea, or other symptoms.  Her medical history includes alcohol use disorder and adjustment disorder with anxiety and depressed mood.  Patient requests strep test; she declines COVID test.  The history is provided by the patient and medical records.    History reviewed. No pertinent past medical history.  Patient Active Problem List   Diagnosis Date Noted  . No-show for appointment 10/25/2019  . Rash and nonspecific skin eruption 07/19/2019  . 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 Benign skin mole 02/18/2018  . Alcohol use disorder, moderate, dependence ([place]) 10/07/2017  . Adjustment disorder with mixed anxiety and depressed mood 10/07/2017    History reviewed. No pertinent surgical history.  OB History    Gravida  0   Para      Term      Preterm      AB      Living        SAB      TAB      Ectopic      Multiple      Live Births               Home Medications    Prior to Admission medications   Medication Sig Start Date End Date Taking? Authorizing Provider  cetirizine (ZYRTEC) 10 MG tablet Take 1 tablet (10 mg total) by mouth daily. 07/19/19   Bonnita Hollow, MD  hydrocortisone 2.5 % ointment Apply topically 2 (two) times daily. 07/19/19   Bonnita Hollow, MD  neomycin-polymyxin-hydrocortisone (CORTISPORIN) 3.5-10000-1 OTIC suspension Place 4 drops into both ears 3 (three) times daily. 12/18/19   Sharion Balloon, NP    Family History Family History  Problem Relation Age of Onset  . Cancer  Mother        Acute Myloid   . Depression Father   . Drug abuse Father   . Hypertension Father   . Dementia Other   . Early death Other   . Heart disease Other   . Hyperlipidemia Other   . Intellectual disability Other   . Diabetes Other   . Depression Other   . Sudden death Other     Social History Social History   Tobacco Use  . Smoking status: Current Every Day Smoker    Types: E-cigarettes  . Smokeless tobacco: Never Used  . Tobacco comment: e-cig. often   Vaping Use  . Vaping Use: Every day  Substance Use Topics  . Alcohol use: Not Currently    Alcohol/week: 10.0 standard drinks    Types: 10 Standard drinks or equivalent per week  . Drug use: Not Currently    Frequency: 1.0 times per week    Types: Marijuana     Allergies   Patient has no known allergies.   Review of Systems Review of Systems  Constitutional: Negative for chills and fever.  HENT: Positive for congestion, ear pain and sore throat.   Eyes: Negative for pain and visual disturbance.  Respiratory: Positive for cough. Negative for shortness of breath.   Cardiovascular: Negative for chest pain and palpitations.  Gastrointestinal: Negative for abdominal pain, diarrhea and vomiting.  Genitourinary: Negative for dysuria and hematuria.  Musculoskeletal: Negative for arthralgias and back pain.  Skin: Negative for color change and rash.  Neurological: Negative for seizures and syncope.  All other systems reviewed and are negative.    Physical Exam Triage Vital Signs ED Triage Vitals  Enc Vitals Group     BP      Pulse      Resp      Temp      Temp src      SpO2      Weight      Height      Head Circumference      Peak Flow      Pain Score      Pain Loc      Pain Edu?      Excl. in [place]?    No data found.  Updated Vital Signs BP 118/73 (BP Location: Right Arm)   Pulse 85   Temp 99.3 F (37.4 C) (Oral)   Resp 18   Ht 5\' 6"  (1.676 m)   Wt 186 lb (84.4 kg)   LMP 12/18/2019   SpO2  98%   BMI 30.02 kg/m   Visual Acuity Right Eye Distance:   Left Eye Distance:   Bilateral Distance:    Right Eye Near:   Left Eye Near:    Bilateral Near:     Physical Exam Vitals and nursing note reviewed.  Constitutional:      General: She is not in acute distress.    Appearance: She is well-developed. She is not ill-appearing.  HENT:     Head: Normocephalic and atraumatic.     Right Ear: Tympanic membrane normal.     Left Ear: Tympanic membrane normal.     Ears:     Comments: Bilateral ear canals erythematous. Scant white-yellow drainage. Mild edema. Tender with movement. TMs clear.     Mouth/Throat:     Mouth: Mucous membranes are moist.     Pharynx: Posterior oropharyngeal erythema present. No oropharyngeal exudate.  Eyes:     Conjunctiva/sclera: Conjunctivae normal.  Cardiovascular:     Rate and Rhythm: Normal rate and regular rhythm.     Heart sounds: Normal heart sounds.  Pulmonary:     Effort: Pulmonary effort is normal. No respiratory distress.     Breath sounds: Normal breath sounds.  Abdominal:     Palpations: Abdomen is soft.     Tenderness: There is no abdominal tenderness. There is no guarding or rebound.  Musculoskeletal:     Cervical back: Neck supple.  Skin:    General: Skin is warm and dry.  Neurological:     General: No focal deficit present.     Mental Status: She is alert and oriented to person, place, and time.     Gait: Gait normal.  Psychiatric:        Mood and Affect: Mood normal.        Behavior: Behavior normal.      UC Treatments / Results  Labs (all labs ordered are listed, but only abnormal results are displayed) Labs Reviewed  CULTURE, GROUP A STREP [facility])  POCT RAPID STREP A, ED / UC    EKG   Radiology No results found.  Procedures Procedures (including critical care time)  Medications Ordered in UC Medications - No  data to display  Initial Impression / Assessment and Plan / UC Course  I have reviewed the  triage vital signs and the nursing notes.  Pertinent labs & imaging results that were available during my care of the patient were reviewed by me and considered in my medical decision making (see chart for details).   Bilateral otitis externa.  Viral URI.  Rapid strep negative; culture pending.  Treating with Cortisporin eardrops.  Instructed patient to follow-up with her PCP if her symptoms are not improving.  Patient agrees to plan of care.   Final Clinical Impressions(s) / UC Diagnoses   Final diagnoses:  Acute otitis externa of both ears, unspecified type  Viral URI     Discharge Instructions     Use the eardrops as directed.    Follow up with your primary care provider if your symptoms are not improving.       ED Prescriptions    Medication Sig Dispense Auth. Provider   neomycin-polymyxin-hydrocortisone (CORTISPORIN) 3.5-10000-1 OTIC suspension Place 4 drops into both ears 3 (three) times daily. 10 mL Sharion Balloon, NP     PDMP not reviewed this encounter.   Sharion Balloon, NP 12/18/19 [phone]

## 2019-12-21 LAB — CULTURE, GROUP A STREP (THRC)

## 2019-12-24 IMAGING — CT CT ABDOMEN AND PELVIS WITH CONTRAST
2 of 4 series · 16 of 46 positions shown, 18 images · IV contrast (APPLIED)
Comparison: Prior ultrasound from 03/26/2018.

CLINICAL DATA: Initial evaluation for acute abdominal pain, nausea,
vomiting, interval diarrhea.

EXAM:
CT ABDOMEN AND PELVIS WITH CONTRAST
TECHNIQUE: Multidetector CT imaging of the abdomen and pelvis was performed
using the standard protocol following bolus administration of
intravenous contrast.
CONTRAST:  100mL OMNIPAQUE IOHEXOL 300 MG/ML  SOLN

[Series 3: abdomen 5.0 · axial · 0.86mm/px · z∈[+743,+1153]mm · 13 of 92 slices shown, 15 images]
[im 5/92  soft-tissue]
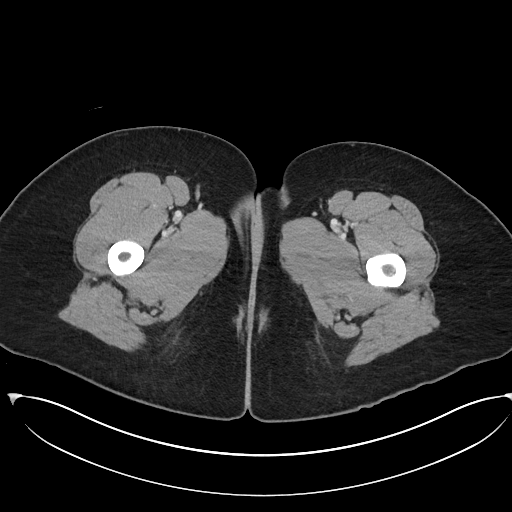
[im 5/92  bone]
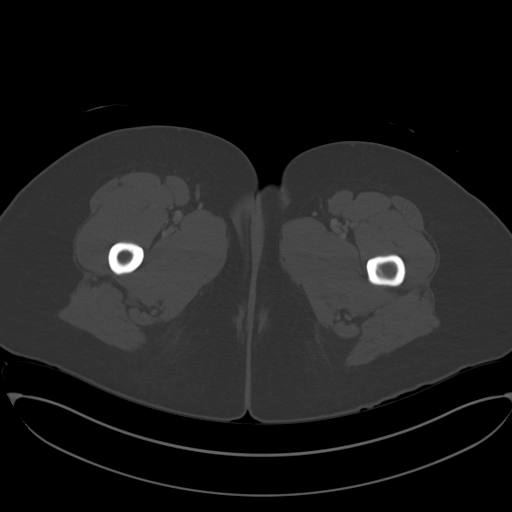
[im 15/92  soft-tissue]
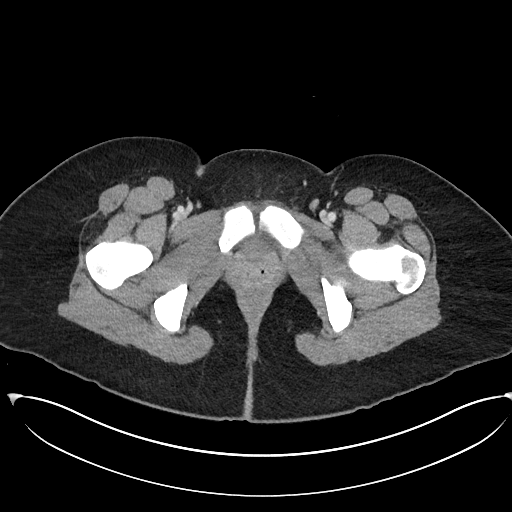
[im 20/92  soft-tissue]
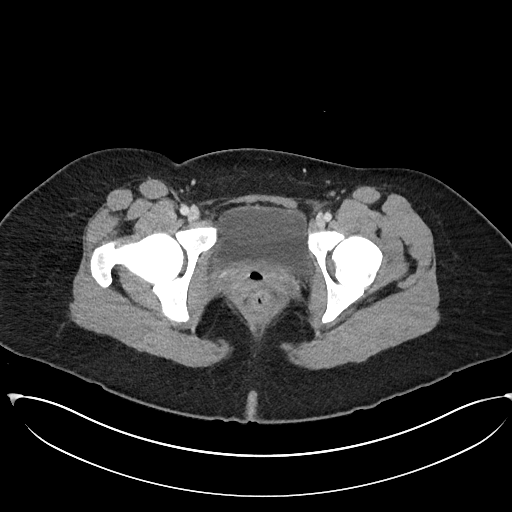
[im 24/92  soft-tissue]
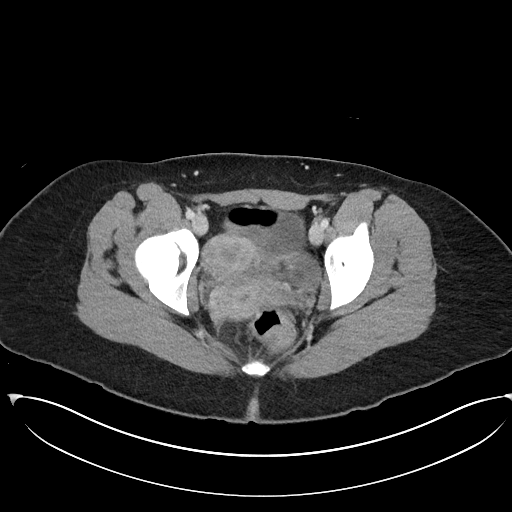
[im 34/92  soft-tissue]
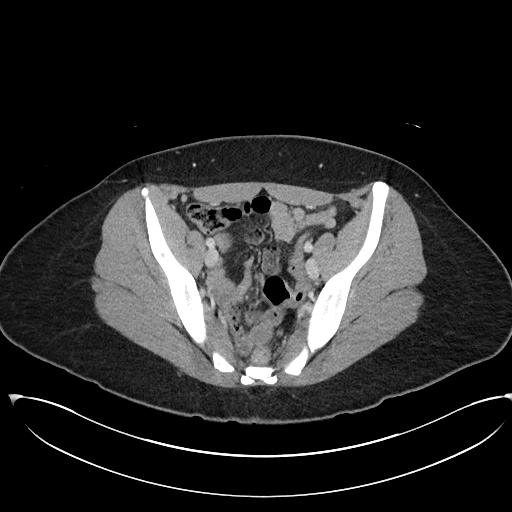
[im 39/92  soft-tissue]
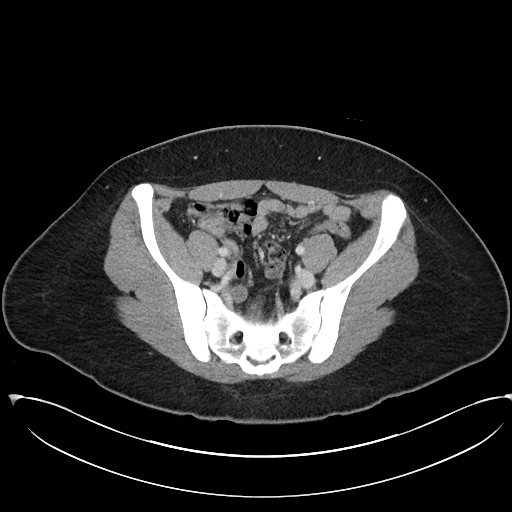
[im 48/92  soft-tissue]
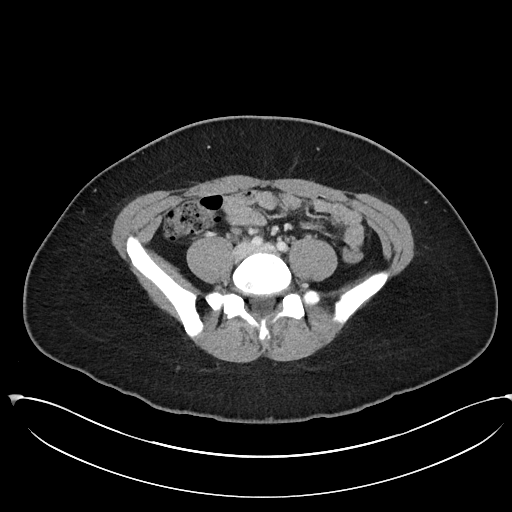
[im 53/92  soft-tissue]
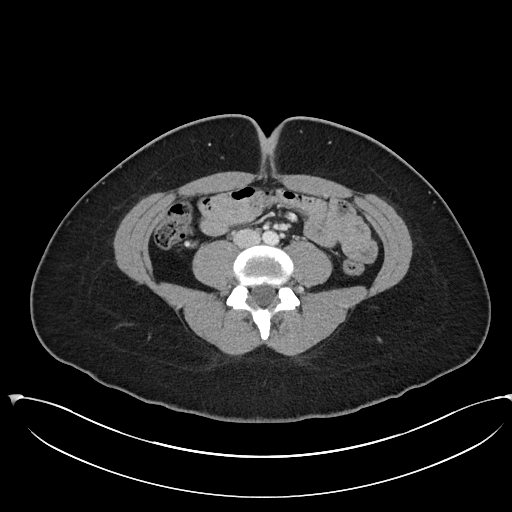
[im 58/92  soft-tissue]
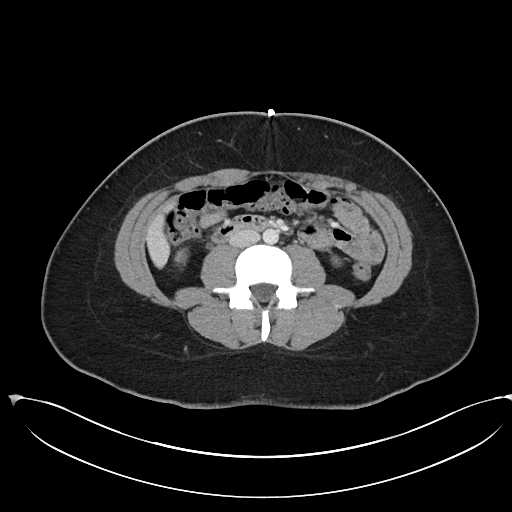
[im 58/92  bone]
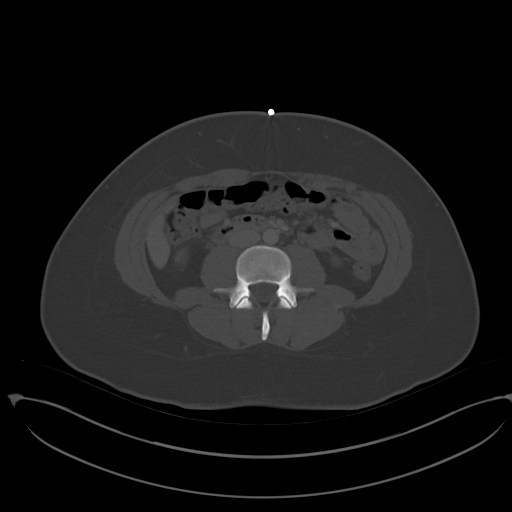
[im 68/92  soft-tissue]
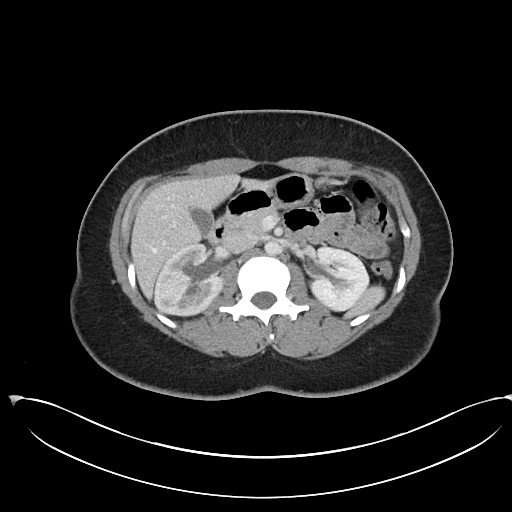
[im 72/92  soft-tissue]
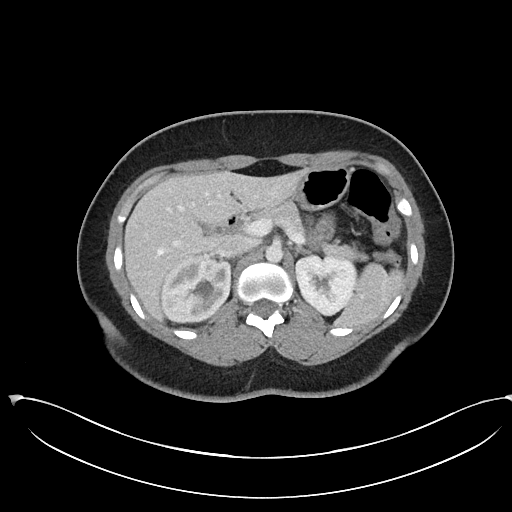
[im 77/92  soft-tissue]
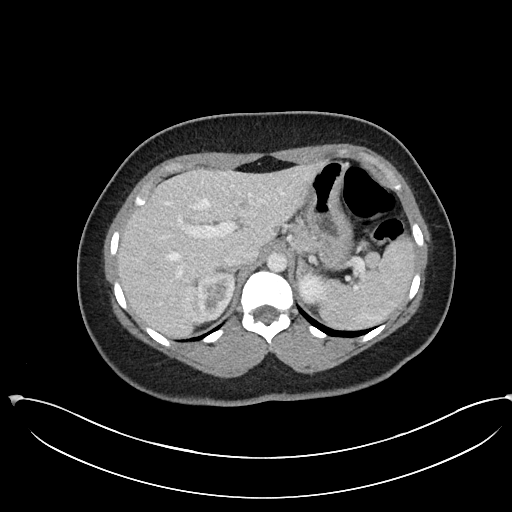
[im 87/92  soft-tissue]
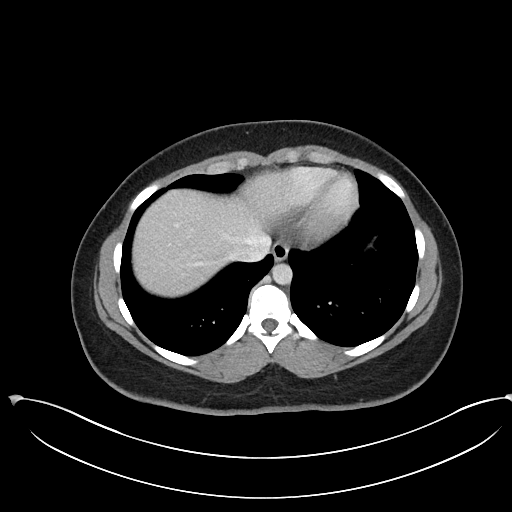

[Series 6: abdomen 3.0 mpr cor · coronal · 0.89mm/px · 3 of 98 slices shown]
[im 33/98  soft-tissue]
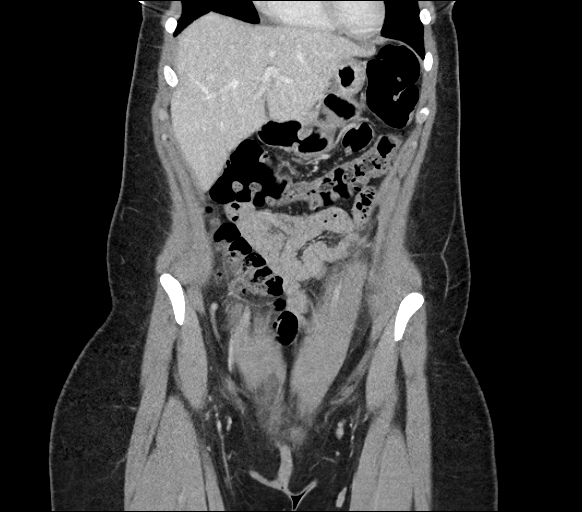
[im 44/98  soft-tissue]
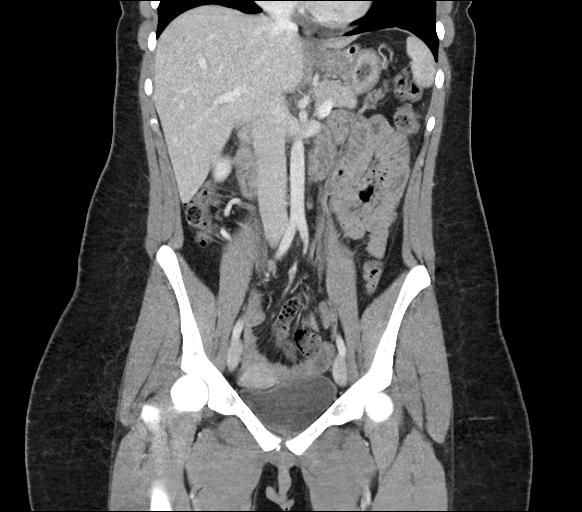
[im 54/98  soft-tissue]
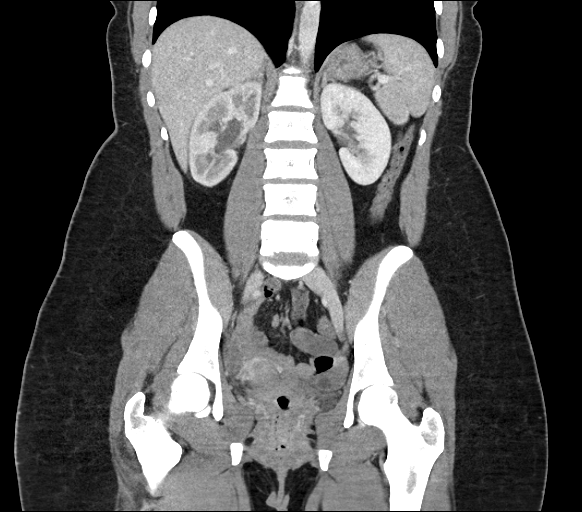

[16 of 46 positions shown; findings below may reference images not displayed]

FINDINGS: Lower chest: Visualized lung bases are clear.

Hepatobiliary: Liver demonstrates a normal contrast enhanced
appearance. Gallbladder within normal limits. No biliary dilatation.

Pancreas: Pancreas within normal limits.

Spleen: Spleen within normal limits.

Adrenals/Urinary Tract: Adrenal glands are normal.

Asymmetric enlargement of the right kidney with delayed nephrogram.
There is a 6 mm obstructive stone position within the distal right
ureter with secondary mild right hydroureteronephrosis. No other
radiopaque calculi seen within the right kidney or along the course
of the right renal collecting system. Left kidney unremarkable
without nephrolithiasis or hydronephrosis. No focal enhancing renal
mass seen about either kidney.

Bladder partially distended without acute finding. No layering
stones within the bladder lumen.

Stomach/Bowel: Stomach within normal limits. No evidence for bowel
obstruction. No evidence for acute appendicitis. No acute
inflammatory changes seen about the bowels.

Vascular/Lymphatic: Normal intravascular enhancement seen throughout
the intra-abdominal aorta and its branch vessels. No adenopathy.

Reproductive: Uterus and ovaries within normal limits for age.

Other: No free air or fluid. Small fat containing paraumbilical
hernia without associated inflammation.

Musculoskeletal: No acute osseous finding. No discrete lytic or
blastic osseous lesions.
IMPRESSION: 1. 6 mm obstructive stone with distal right ureter with secondary
mild right hydroureteronephrosis.
2. No other acute intra-abdominal or pelvic process.

## 2020-05-10 DIAGNOSIS — Z13220 Encounter for screening for lipoid disorders: Secondary | ICD-10-CM | POA: Diagnosis not present

## 2020-05-10 DIAGNOSIS — Z113 Encounter for screening for infections with a predominantly sexual mode of transmission: Secondary | ICD-10-CM | POA: Diagnosis not present

## 2020-05-10 DIAGNOSIS — Z23 Encounter for immunization: Secondary | ICD-10-CM | POA: Diagnosis not present

## 2020-05-10 DIAGNOSIS — R109 Unspecified abdominal pain: Secondary | ICD-10-CM | POA: Diagnosis not present

## 2020-05-10 DIAGNOSIS — Z0000 Encounter for general adult medical examination without abnormal findings: Secondary | ICD-10-CM | POA: Diagnosis not present

## 2020-05-24 ENCOUNTER — Ambulatory Visit: Payer: Medicaid Other | Admitting: Internal Medicine

## 2020-07-16 ENCOUNTER — Other Ambulatory Visit: Payer: Self-pay | Admitting: Physician Assistant

## 2020-07-16 DIAGNOSIS — R1032 Left lower quadrant pain: Secondary | ICD-10-CM

## 2020-07-20 ENCOUNTER — Ambulatory Visit
Admission: RE | Admit: 2020-07-20 | Discharge: 2020-07-20 | Disposition: A | Discharge status: Home / Self Care | Payer: Medicaid Other | Source: Ambulatory Visit | Attending: Physician Assistant | Admitting: Physician Assistant

## 2020-07-20 DIAGNOSIS — R1032 Left lower quadrant pain: Secondary | ICD-10-CM

## 2020-07-20 IMAGING — CT CT ABD-PELV W/ CM
1 series · 14 of 40 positions shown, 17 images · IV contrast (iopamidol)
Comparison: June 17, 2018.

CLINICAL DATA: Left lower quadrant abdominal pain.

EXAM:
CT ABDOMEN AND PELVIS WITH CONTRAST
TECHNIQUE: Multidetector CT imaging of the abdomen and pelvis was performed
using the standard protocol following bolus administration of
intravenous contrast.
CONTRAST:  100mL V194A4-ORR IOPAMIDOL (V194A4-ORR) INJECTION 61%

[Series 6: abd pelvis 2.00 br40 s3 cor · coronal · 0.76mm/px · 14 of 193 slices shown, 17 images]
[im 7/193  lung]
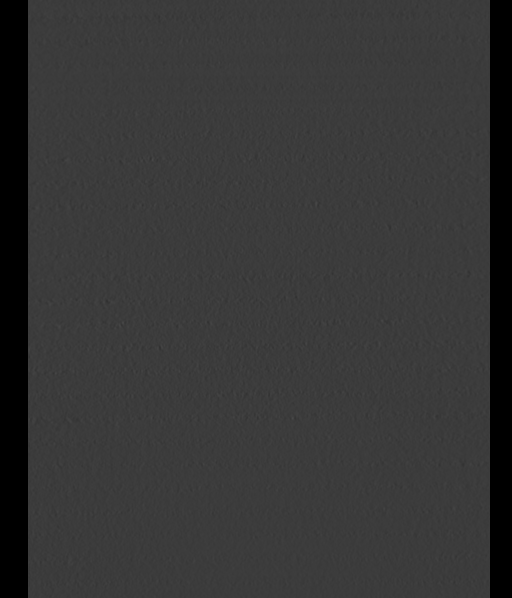
[im 13/193  soft-tissue]
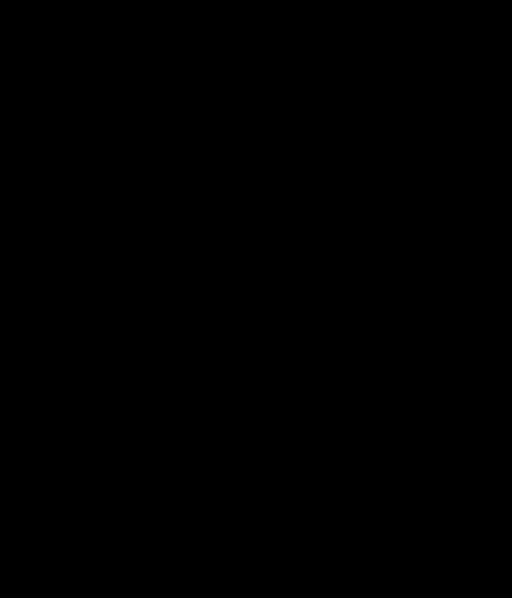
[im 13/193  lung]
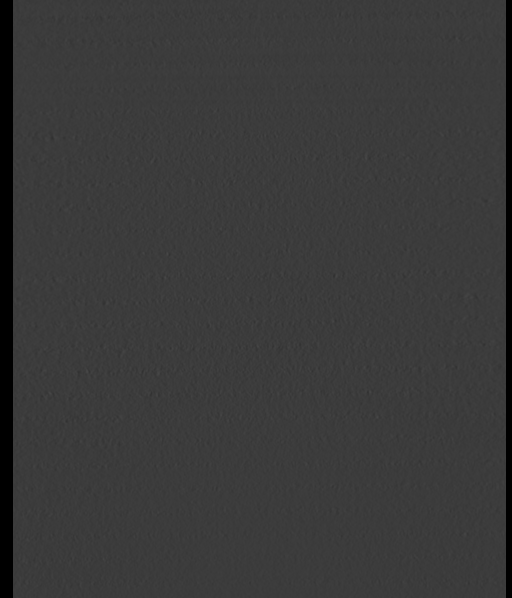
[im 13/193  bone]
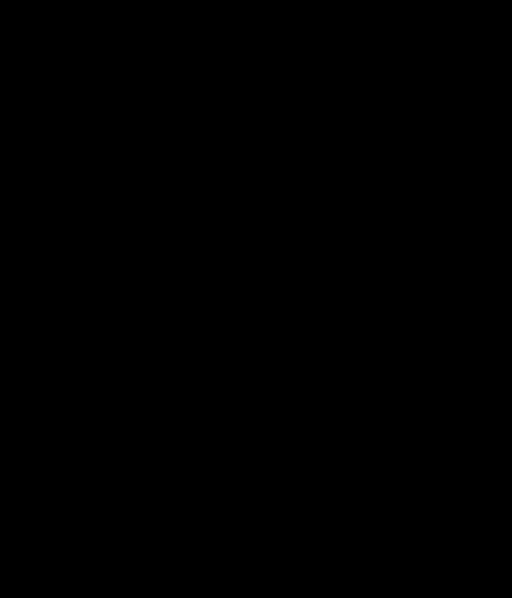
[im 19/193  lung]
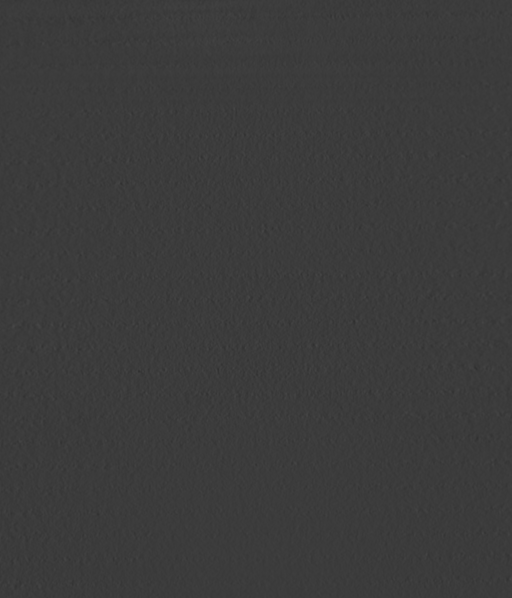
[im 25/193  lung]
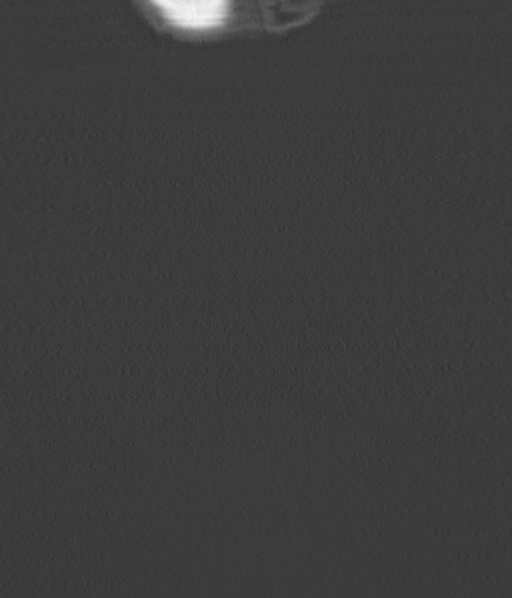
[im 31/193  soft-tissue]
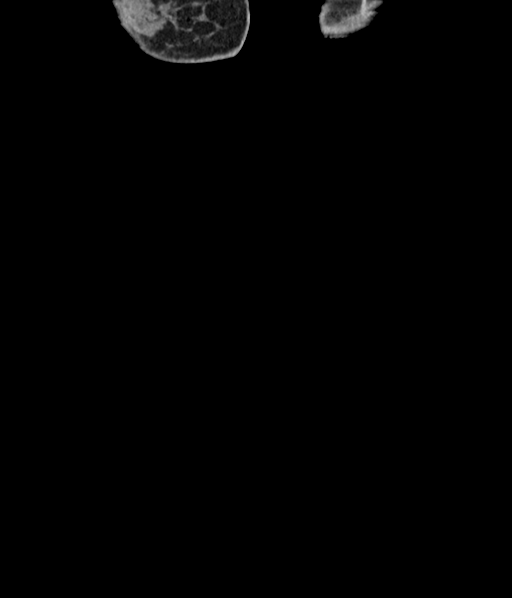
[im 44/193  soft-tissue]
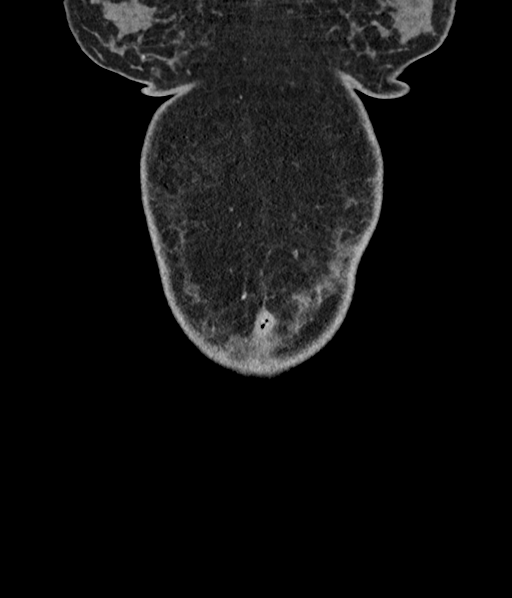
[im 65/193  soft-tissue]
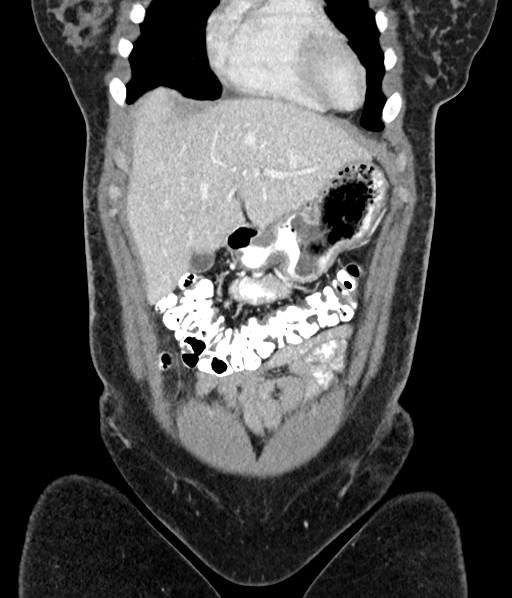
[im 81/193  soft-tissue]
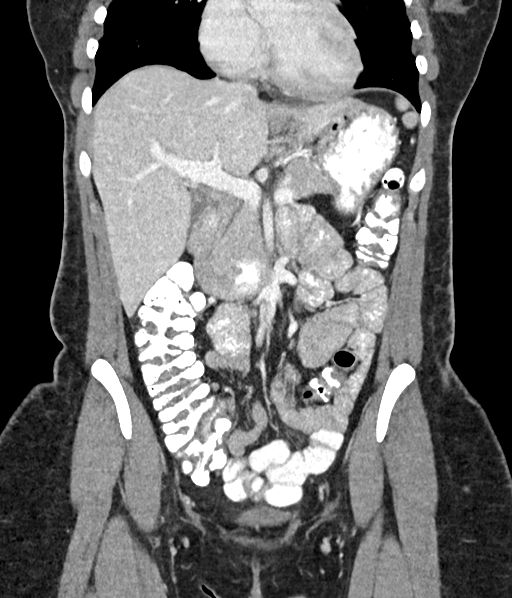
[im 100/193  soft-tissue]
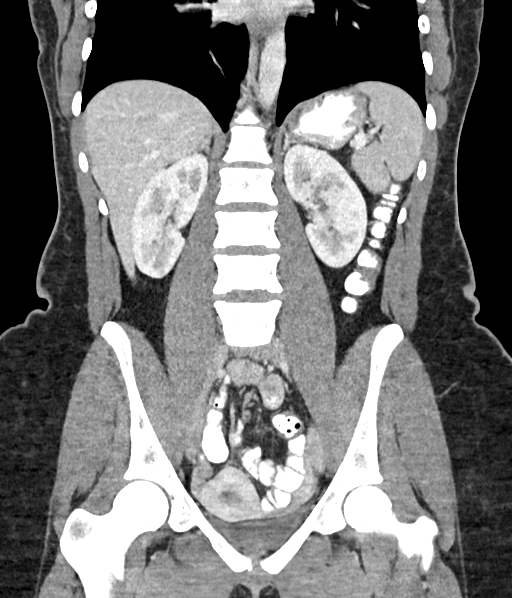
[im 112/193  soft-tissue]
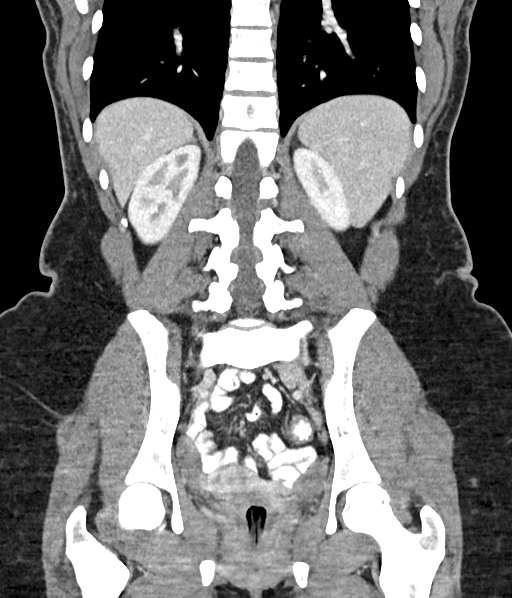
[im 129/193  soft-tissue]
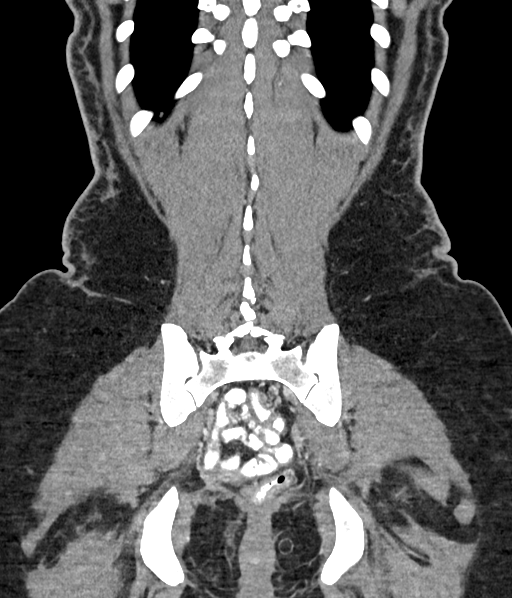
[im 149/193  soft-tissue]
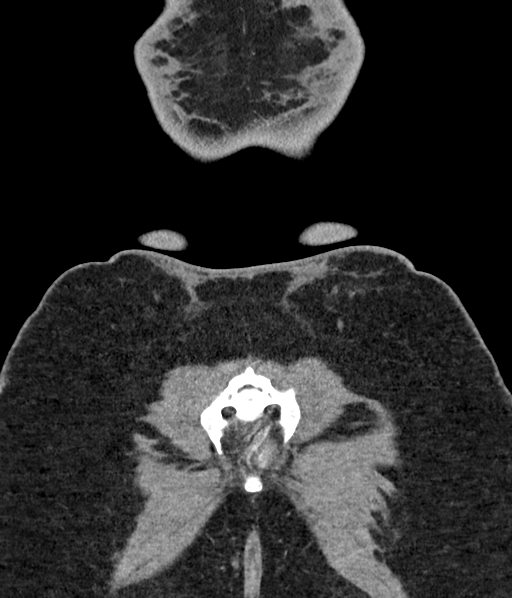
[im 149/193  bone]
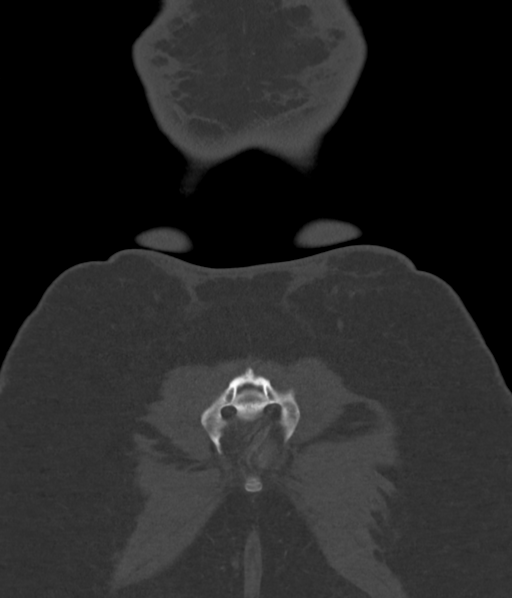
[im 162/193  soft-tissue]
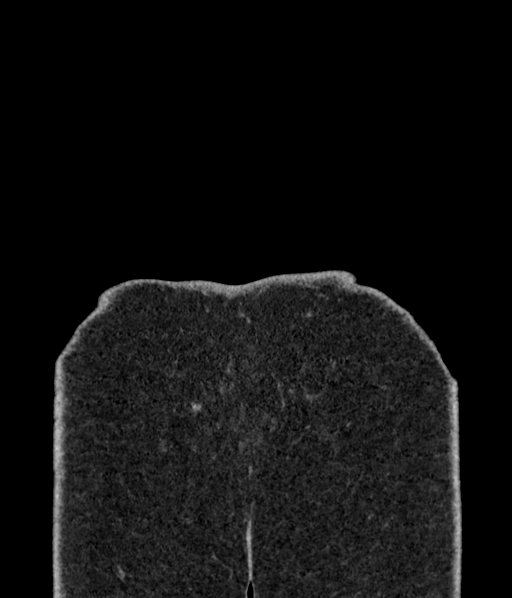
[im 180/193  soft-tissue]
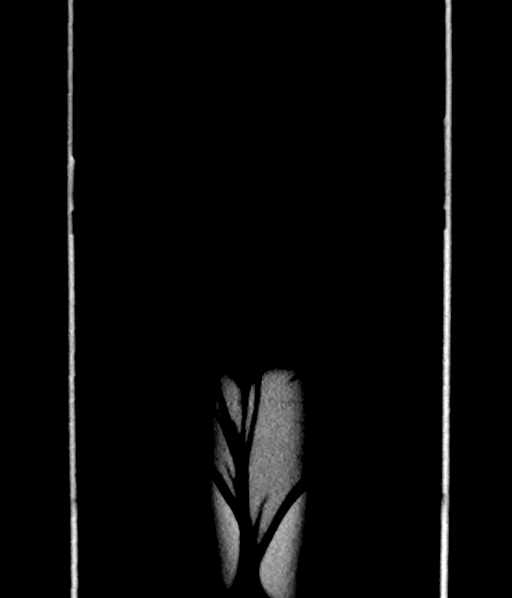

[14 of 40 positions shown; findings below may reference images not displayed]

FINDINGS: Lower chest: No acute abnormality.

Hepatobiliary: No focal liver abnormality is seen. No gallstones,
gallbladder wall thickening, or biliary dilatation.

Pancreas: Unremarkable. No pancreatic ductal dilatation or
surrounding inflammatory changes.

Spleen: Normal in size without focal abnormality.

Adrenals/Urinary Tract: Adrenal glands are unremarkable. Kidneys are
normal, without renal calculi, focal lesion, or hydronephrosis.
Bladder is unremarkable.

Stomach/Bowel: Stomach is within normal limits. Appendix appears
normal. No evidence of bowel wall thickening, distention, or
inflammatory changes.

Vascular/Lymphatic: No significant vascular findings are present. No
enlarged abdominal or pelvic lymph nodes.

Reproductive: Uterus and bilateral adnexa are unremarkable.

Other: No abdominal wall hernia or abnormality. No abdominopelvic
ascites.

Musculoskeletal: No acute or significant osseous findings.
IMPRESSION: No definite abnormality seen in the abdomen or pelvis.

## 2020-07-20 MED ORDER — IOPAMIDOL (ISOVUE-300) INJECTION 61%
100.0000 mL | Freq: Once | INTRAVENOUS | Status: AC | PRN
  Administered 2020-07-20: 100 mL via INTRAVENOUS

## 2021-02-21 ENCOUNTER — Encounter: Payer: Self-pay | Admitting: Internal Medicine

## 2021-02-21 ENCOUNTER — Ambulatory Visit
Admission: EM | Admit: 2021-02-21 | Discharge: 2021-02-21 | Disposition: A | Discharge status: Home / Self Care | Payer: Medicaid Other | Attending: Internal Medicine | Admitting: Internal Medicine

## 2021-02-21 DIAGNOSIS — J039 Acute tonsillitis, unspecified: Secondary | ICD-10-CM

## 2021-02-21 DIAGNOSIS — H6123 Impacted cerumen, bilateral: Secondary | ICD-10-CM

## 2021-02-21 LAB — POCT RAPID STREP A (OFFICE): Rapid Strep A Screen: NEGATIVE

## 2021-02-21 MED ORDER — AZITHROMYCIN 500 MG PO TABS: 500.0000 mg | ORAL_TABLET | Freq: Every day | ORAL | 0 refills | Status: DC

## 2021-02-21 NOTE — ED Triage Notes (Signed)
Pt presents with ST and bilateral ear pain x 2 days

## 2021-02-21 NOTE — ED Provider Notes (Signed)
Leah [name]    CSN: [id number] Arrival date & time: 02/21/21  4098      History   Chief Complaint Chief Complaint  Patient presents with   Sore Throat    HPI Leah [name] is a 21 y.o. female presents with onset of ST x 3 days. She denies URI or fever. The R side is more tender than the L.  She also feels her ears are clogged, but denies pain.    History reviewed. No pertinent past medical history.  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 No pertinent surgical history.  OB History     Gravida  0   Para      Term      Preterm      AB      Living         SAB      IAB      Ectopic      Multiple      Live Births               Home Medications    Prior to Admission medications   Medication Sig Start Date End Date Taking? Authorizing Provider  neomycin-polymyxin-hydrocortisone (CORTISPORIN) 3.5-10000-1 OTIC suspension Place 4 drops into both ears 3 (three) times daily. 12/18/19   Sharion Balloon, NP  valACYclovir (VALTREX) 500 MG tablet Take by mouth. 01/21/21   [provider]    Family History Family History  Problem Relation Age of Onset   Cancer Mother        Acute Myloid    Depression Father    Drug abuse Father    Hypertension Father    Dementia Other    Early death Other    Heart disease Other    Hyperlipidemia Other    Intellectual disability Other    Diabetes Other    Depression Other    Sudden death Other     Social History Social History   Tobacco Use   Smoking status: Every Day    Types: E-cigarettes   Smokeless tobacco: Never   Tobacco comments:    e-cig. often   Vaping Use    Vaping Use: Every day  Substance Use Topics   Alcohol use: Not Currently    Alcohol/week: 10.0 standard drinks    Types: 10 Standard drinks or equivalent per week   Drug use: Not Currently    Frequency: 1.0 times per week    Types: Marijuana     Allergies   Patient has no known allergies.   Review of Systems Review of Systems  Constitutional:  Negative for chills and fever.  HENT:  Positive for sore throat. Negative for congestion, rhinorrhea and trouble swallowing.   Eyes:  Negative for discharge.  Respiratory:  Negative for cough.   Hematological:  Negative for adenopathy.    Physical Exam Triage Vital Signs ED Triage Vitals  Enc Vitals Group     BP 02/21/21 0844 108/74     Pulse Rate 02/21/21 0844 93     Resp 02/21/21 0844 16     Temp 02/21/21 0844 98.2 F (36.8 C)     Temp Source 02/21/21 0844  Oral     SpO2 02/21/21 0844 95 %     Weight --      Height --      Head Circumference --      Peak Flow --      Pain Score 02/21/21 0846 5     Pain Loc --      Pain Edu? --      Excl. in [place]? --    No data found.  Updated Vital Signs BP 108/74 (BP Location: Left Arm)    Pulse 93    Temp 98.2 F (36.8 C) (Oral)    Resp 16    LMP 01/18/2021    SpO2 95%   Visual Acuity Right Eye Distance:   Left Eye Distance:   Bilateral Distance:    Right Eye Near:   Left Eye Near:    Bilateral Near:      Physical Exam Vitals signs and nursing note reviewed.  Constitutional:      General: She is not in acute distress.    Appearance: Normal appearance. She is not ill-appearing, toxic-appearing or diaphoretic.  HENT:     Head: Normocephalic.     Right Ear: Full of wax, but after lavage Tympanic membrane, ear canal and external ear normal.     Left Ear: full of wax, but after lavage Tympanic membrane, ear canal and external ear normal.     Nose: Nose normal.     Mouth/Throat: erythematous tonsils R>L. No exudate. Uvula is mid line and not swollen.     Mouth: Mucous membranes  are moist.  Eyes:     General: No scleral icterus.       Right eye: No discharge.        Left eye: No discharge.     Conjunctiva/sclera: Conjunctivae normal.  Neck:     Musculoskeletal: Neck supple. No neck rigidity.  Cardiovascular:     Rate and Rhythm: Normal rate and regular rhythm.     Heart sounds: No murmur.  Pulmonary:     Effort: Pulmonary effort is normal.     Breath sounds: Normal breath sounds.   Musculoskeletal: Normal range of motion.  Lymphadenopathy:     Cervical: Has mild cervical adenopathy.  Skin:    General: Skin is warm and dry.     Coloration: Skin is not jaundiced.     Findings: No rash.  Neurological:     Mental Status: She is alert and oriented to person, place, and time.     Gait: Gait normal.  Psychiatric:        Mood and Affect: Mood normal.        Behavior: Behavior normal.        Thought Content: Thought content normal.        Judgment: Judgment normal.   UC Treatments / Results  Labs (all labs ordered are listed, but only abnormal results are displayed) Labs Reviewed - No data to display  EKG   Radiology No results found.  Procedures Procedures (including critical care time)  Medications Ordered in UC Medications - No data to display  Initial Impression / Assessment and Plan / UC Course  I have reviewed the triage vital signs and the nursing notes. Pertinent labs results that were available during my care of the patient were reviewed by me and considered in my medical decision making (see chart for details). Tonsillitis Bilateral cerumen impaction Lavage done. Throat culture sent out, and in the mean time placed on Azithromycin.  Final Clinical Impressions(s) / UC Diagnoses   Final diagnoses:  None   Discharge Instructions   None    ED Prescriptions   None    PDMP not reviewed this encounter.   Shelby Mattocks, [place] 02/21/21 [id number]

## 2021-02-24 LAB — CULTURE, GROUP A STREP (THRC)

## 2021-03-04 ENCOUNTER — Encounter: Payer: Self-pay | Admitting: Physician Assistant

## 2021-04-05 ENCOUNTER — Ambulatory Visit
Admission: EM | Admit: 2021-04-05 | Discharge: 2021-04-05 | Disposition: A | Discharge status: Home / Self Care | Payer: Medicaid Other | Attending: Student | Admitting: Student

## 2021-04-05 ENCOUNTER — Encounter: Payer: Self-pay | Admitting: Emergency Medicine

## 2021-04-05 ENCOUNTER — Other Ambulatory Visit: Payer: Self-pay

## 2021-04-05 DIAGNOSIS — H66002 Acute suppurative otitis media without spontaneous rupture of ear drum, left ear: Secondary | ICD-10-CM | POA: Diagnosis not present

## 2021-04-05 MED ORDER — AMOXICILLIN 875 MG PO TABS: 875.0000 mg | ORAL_TABLET | Freq: Two times a day (BID) | ORAL | 0 refills | Status: AC

## 2021-04-05 NOTE — Discharge Instructions (Addendum)
-  Amoxicillin twice daily x7 days. You can take with food if you have a sensitive stomach.  -You can take Tylenol up to 1000 mg 3 times daily, and ibuprofen up to 600 mg 3 times daily with food.  You can take these together, or alternate every 3-4 hours.

## 2021-04-05 NOTE — ED Triage Notes (Signed)
Pt presents with left ear pain x 1 week. 

## 2021-04-05 NOTE — ED Provider Notes (Signed)
[name]    CSN: [id number] Arrival date & time: 04/05/21  0854      History   Chief Complaint Chief Complaint  Patient presents with   Otalgia    HPI CORINDA AMMON is a 21 y.o. female presenting with L ear pain x1 week following viral syndrome. History noncontributory. Throbbing pain. Denies hearing changes, dizziness, tinnitus. Also some new R ear pain. States cough is resolving. Ibuprofen providing some relief.   HPI  History reviewed. No pertinent past medical history.  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 No pertinent surgical history.  OB History     Gravida  0   Para      Term      Preterm      AB      Living         SAB      IAB      Ectopic      Multiple      Live Births               Home Medications    Prior to Admission medications   Medication Sig Start Date End Date Taking? Authorizing Provider  amoxicillin (AMOXIL) 875 MG tablet Take 1 tablet (875 mg total) by mouth 2 (two) times daily for 7 days. 04/05/21 04/12/21 Yes Hazel Sams, PA-C    Family History Family History  Problem Relation Age of Onset   Cancer Mother        Acute Myloid    Depression Father    Drug abuse Father    Hypertension Father    Dementia Other    Early death Other    Heart disease Other    Hyperlipidemia Other    Intellectual disability Other    Diabetes Other    Depression Other    Sudden death Other     Social History Social History   Tobacco Use   Smoking status: Every Day    Types: E-cigarettes   Smokeless tobacco: Never   Tobacco comments:    e-cig. often   Vaping Use   Vaping Use: Every day   Substance Use Topics   Alcohol use: Not Currently    Alcohol/week: 10.0 standard drinks    Types: 10 Standard drinks or equivalent per week   Drug use: Not Currently    Frequency: 1.0 times per week    Types: Marijuana     Allergies   Patient has no known allergies.   Review of Systems Review of Systems  HENT:  Positive for ear pain.     Physical Exam Triage Vital Signs ED Triage Vitals  Enc Vitals Group     BP 04/05/21 0928 126/84     Pulse Rate 04/05/21 0928 100     Resp 04/05/21 0928 18     Temp 04/05/21 0928 98.1 F (36.7 C)     Temp src --      SpO2 04/05/21 0928 97 %     Weight --      Height --      Head Circumference --      Peak Flow --      Pain  Score 04/05/21 0929 7     Pain Loc --      Pain Edu? --      Excl. in [place]? --    No data found.  Updated Vital Signs BP 126/84    Pulse 100    Temp 98.1 F (36.7 C)    Resp 18    LMP 02/26/2021    SpO2 97%   Visual Acuity Right Eye Distance:   Left Eye Distance:   Bilateral Distance:    Right Eye Near:   Left Eye Near:    Bilateral Near:     Physical Exam Vitals reviewed.  Constitutional:      Appearance: Normal appearance. She is not ill-appearing.  HENT:     Head: Normocephalic and atraumatic.     Right Ear: Hearing, tympanic membrane, ear canal and external ear normal. No swelling or tenderness. No middle ear effusion. There is no impacted cerumen. No mastoid tenderness. Tympanic membrane is not injected, scarred, perforated, erythematous, retracted or bulging.     Left Ear: Hearing, ear canal and external ear normal. Tenderness present. No swelling.  No middle ear effusion. There is no impacted cerumen. No mastoid tenderness. Tympanic membrane is erythematous and bulging. Tympanic membrane is not injected, scarred, perforated or retracted.     Mouth/Throat:     Pharynx: Oropharynx is clear. No oropharyngeal exudate or posterior oropharyngeal erythema.  Cardiovascular:     Rate and Rhythm: Normal  rate and regular rhythm.     Heart sounds: Normal heart sounds.  Pulmonary:     Effort: Pulmonary effort is normal.     Breath sounds: Normal breath sounds.  Lymphadenopathy:     Cervical: No cervical adenopathy.  Neurological:     General: No focal deficit present.     Mental Status: She is alert and oriented to person, place, and time.  Psychiatric:        Mood and Affect: Mood normal.        Behavior: Behavior normal.        Thought Content: Thought content normal.        Judgment: Judgment normal.     UC Treatments / Results  Labs (all labs ordered are listed, but only abnormal results are displayed) Labs Reviewed - No data to display  EKG   Radiology No results found.  Procedures Procedures (including critical care time)  Medications Ordered in UC Medications - No data to display  Initial Impression / Assessment and Plan / UC Course  I have reviewed the triage vital signs and the nursing notes.  Pertinent labs & imaging results that were available during my care of the patient were reviewed by me and considered in my medical decision making (see chart for details).     This patient is a very pleasant 21 y.o. year old female presenting with L ear AOM. Afebrile, nontachy. States she is not pregnant or breastfeeding.  Amoxicillin as below.   ED return precautions discussed. Patient verbalizes understanding and agreement.     Final Clinical Impressions(s) / UC Diagnoses   Final diagnoses:  Non-recurrent acute suppurative otitis media of left ear without spontaneous rupture of tympanic membrane     Discharge Instructions      -Amoxicillin twice daily x7 days. You can take with food if you have a sensitive stomach.  -You can take Tylenol up to 1000 mg 3 times daily, and ibuprofen up to 600 mg 3 times daily with food.  You can take these  together, or alternate every 3-4 hours.    ED Prescriptions     Medication Sig Dispense Auth. Provider   amoxicillin  (AMOXIL) 875 MG tablet Take 1 tablet (875 mg total) by mouth 2 (two) times daily for 7 days. 14 tablet Hazel Sams, PA-C      PDMP not reviewed this encounter.   Hazel Sams, PA-C 04/05/21 [phone]

## 2021-05-21 ENCOUNTER — Encounter: Payer: Self-pay | Admitting: Emergency Medicine

## 2021-05-21 ENCOUNTER — Ambulatory Visit
Admission: EM | Admit: 2021-05-21 | Discharge: 2021-05-21 | Disposition: A | Discharge status: Home / Self Care | Payer: Medicaid Other | Attending: Student | Admitting: Student

## 2021-05-21 DIAGNOSIS — N760 Acute vaginitis: Secondary | ICD-10-CM | POA: Diagnosis present

## 2021-05-21 LAB — POCT URINALYSIS DIP (MANUAL ENTRY)
Bilirubin, UA: NEGATIVE
Blood, UA: NEGATIVE
Glucose, UA: NEGATIVE mg/dL
Ketones, POC UA: NEGATIVE mg/dL
Leukocytes, UA: NEGATIVE
Nitrite, UA: NEGATIVE
Spec Grav, UA: 1.02 (ref 1.010–1.025)
Urobilinogen, UA: 0.2 E.U./dL
pH, UA: 8.5 — AB (ref 5.0–8.0)

## 2021-05-21 LAB — POCT URINE PREGNANCY: Preg Test, Ur: NEGATIVE

## 2021-05-21 MED ORDER — METRONIDAZOLE 0.75 % VA GEL: 1.0000 | Freq: Every day | VAGINAL | 0 refills | Status: AC

## 2021-05-21 MED ORDER — FLUCONAZOLE 150 MG PO TABS: 150.0000 mg | ORAL_TABLET | Freq: Every day | ORAL | 0 refills | Status: DC

## 2021-05-21 NOTE — Discharge Instructions (Addendum)
-  Metrogel at bedtime x5 days ?-Diflucan (fluconazole) if you develop thick white discharge and external vaginal itching ?-We have sent testing for sexually transmitted infections. We will notify you of any positive results once they are received. If required, we will prescribe any medications you might need. Please refrain from all sexual activity until treatment is complete.  ?-Seek additional medical attention if you develop fevers/chills, new/worsening abdominal pain, new/worsening vaginal discomfort/discharge, etc.  ? ?

## 2021-05-21 NOTE — ED Triage Notes (Signed)
Pt presents with vaginal discharge and urinary frequency x 3 days  ?

## 2021-05-21 NOTE — ED Provider Notes (Signed)
?[facility] ? ? ? ?CSN: [id number] ?Arrival date & time: 05/21/21  1438 ? ? ?  ? ?History   ?Chief Complaint ?Chief Complaint  ?Patient presents with  ? Vaginal Discharge  ? Urinary Frequency  ? ? ?HPI ?LURINE IMEL is a 21 y.o. female presenting with vaginitis.  History BV, states symptoms consistent with this.  Describes thick white vaginal discharge with some external vaginal irritation and urinary frequency.  Discharge does not have an odor.  There is no abdominal pain, flank pain, fever/chills, vaginal rash or lesion. Denies new partner but requesting STI screen.  ? ?HPI ? ?History reviewed. No pertinent past medical history. ? ?Patient Active Problem List  ? Diagnosis Date Noted  ? No-show for appointment 10/25/2019  ? Rash and nonspecific skin eruption 07/19/2019  ? 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 Benign skin mole 02/18/2018  ? Alcohol use disorder, moderate, dependence ([place]) 10/07/2017  ? Adjustment disorder with mixed anxiety and depressed mood 10/07/2017  ? ? ?History reviewed. No pertinent surgical history. ? ?OB History   ? ? Gravida  ?0  ? Para  ?   ? Term  ?   ? Preterm  ?   ? AB  ?   ? Living  ?   ?  ? ? SAB  ?   ? IAB  ?   ? Ectopic  ?   ? Multiple  ?   ? Live Births  ?   ?   ?  ?  ? ? ? ?Home Medications   ? ?Prior to Admission medications   ?Medication Sig Start Date End Date Taking? Authorizing Provider  ?fluconazole (DIFLUCAN) 150 MG tablet Take 1 tablet (150 mg total) by mouth daily. 05/21/21  Yes Hazel Sams, PA-C  ?metroNIDAZOLE (METROGEL VAGINAL) 0.75 % vaginal gel Place 1 Applicatorful vaginally at bedtime for 5 days. 05/21/21 05/26/21 Yes Hazel Sams, PA-C  ?valACYclovir (VALTREX) 500 MG tablet Take by mouth. 05/02/21   [provider]  ? ? ?Family History ?Family History  ?Problem Relation Age of Onset  ? Cancer Mother   ?     Acute Myloid   ? Depression Father   ? Drug abuse Father   ? Hypertension Father   ? Dementia Other   ? Early death Other   ? Heart  disease Other   ? Hyperlipidemia Other   ? Intellectual disability Other   ? Diabetes Other   ? Depression Other   ? Sudden death Other   ? ? ?Social History ?Social History  ? ?Tobacco Use  ? Smoking status: Every Day  ?  Types: E-cigarettes  ? Smokeless tobacco: Never  ? Tobacco comments:  ?  e-cig. often   ?Vaping Use  ? Vaping Use: Every day  ?Substance Use Topics  ? Alcohol use: Not Currently  ?  Alcohol/week: 10.0 standard drinks  ?  Types: 10 Standard drinks or equivalent per week  ? Drug use: Not Currently  ?  Frequency: 1.0 times per week  ?  Types: Marijuana  ? ? ? ?Allergies   ?Patient has no known allergies. ? ? ?Review of Systems ?Review of Systems  ?Genitourinary:  Positive for vaginal discharge.  ? ? ?Physical Exam ?Triage Vital Signs ?ED Triage Vitals  ?Enc Vitals Group  ?   BP 05/21/21 1515 116/78  ?   Pulse Rate 05/21/21 1515 80  ?   Resp 05/21/21 1515 16  ?   Temp 05/21/21 1515  98.8 ?F (37.1 ?C)  ?   Temp Source 05/21/21 1515 Oral  ?   SpO2 05/21/21 1515 95 %  ?   Weight --   ?   Height --   ?   Head Circumference --   ?   Peak Flow --   ?   Pain Score 05/21/21 1459 0  ?   Pain Loc --   ?   Pain Edu? --   ?   Excl. in [place]? --   ? ?No data found. ? ?Updated Vital Signs ?BP 116/78 (BP Location: Left Arm)   Pulse 80   Temp 98.8 ?F (37.1 ?C) (Oral)   Resp 16   LMP 04/17/2021   SpO2 95%  ? ?Visual Acuity ?Right Eye Distance:   ?Left Eye Distance:   ?Bilateral Distance:   ? ?Right Eye Near:   ?Left Eye Near:    ?Bilateral Near:    ? ?Physical Exam ?Vitals reviewed.  ?Constitutional:   ?   General: She is not in acute distress. ?   Appearance: Normal appearance. She is not ill-appearing.  ?HENT:  ?   Head: Normocephalic and atraumatic.  ?   Mouth/Throat:  ?   Mouth: Mucous membranes are moist.  ?   Comments: Moist mucous membranes ?Eyes:  ?   Extraocular Movements: Extraocular movements intact.  ?   Pupils: Pupils are equal, round, and reactive to light.  ?Cardiovascular:  ?   Rate and Rhythm: Normal  rate and regular rhythm.  ?   Heart sounds: Normal heart sounds.  ?Pulmonary:  ?   Effort: Pulmonary effort is normal.  ?   Breath sounds: Normal breath sounds. No wheezing, rhonchi or rales.  ?Abdominal:  ?   General: Bowel sounds are normal. There is no distension.  ?   Palpations: Abdomen is soft. There is no mass.  ?   Tenderness: There is no abdominal tenderness. There is no right CVA tenderness, left CVA tenderness, guarding or rebound.  ?Genitourinary: ?   Comments: deferred ?Skin: ?   General: Skin is warm.  ?   Capillary Refill: Capillary refill takes less than 2 seconds.  ?   Comments: Good skin turgor  ?Neurological:  ?   General: No focal deficit present.  ?   Mental Status: She is alert and oriented to person, place, and time.  ?Psychiatric:     ?   Mood and Affect: Mood normal.     ?   Behavior: Behavior normal.  ? ? ? ?UC Treatments / Results  ?Labs ?(all labs ordered are listed, but only abnormal results are displayed) ?Labs Reviewed  ?POCT URINALYSIS DIP (MANUAL ENTRY) - Abnormal; Notable for the following components:  ?    Result Value  ? pH, UA 8.5 (*)   ? Protein Ur, POC trace (*)   ? All other components within normal limits  ?POCT URINE PREGNANCY  ?CERVICOVAGINAL ANCILLARY ONLY  ? ? ?EKG ? ? ?Radiology ?No results found. ? ?Procedures ?Procedures (including critical care time) ? ?Medications Ordered in UC ?Medications - No data to display ? ?Initial Impression / Assessment and Plan / UC Course  ?I have reviewed the triage vital signs and the nursing notes. ? ?Pertinent labs & imaging results that were available during my care of the patient were reviewed by me and considered in my medical decision making (see chart for details). ? ?  ? ?This patient is a very pleasant 21 y.o. year old female presenting with  vaginitis. Afebrile, nontachycardic, no reproducible abd pain or CVAT. ? ?Will send self-swab for G/C, trich, yeast, BV testing. Declines HIV, RPR. Safe sex precautions.  ? ?Symptoms  consistent with past BV. She is requesting metrogel rather than flagyl PO. Sent. Also sent diflucan to have on hand . ? ?ED return precautions discussed. Patient verbalizes understanding and agreement.  ?.  ? ?Final Clinical Impressions(s) / UC Diagnoses  ? ?Final diagnoses:  ?Vaginitis and vulvovaginitis  ? ? ? ?Discharge Instructions   ? ?  ?-Metrogel at bedtime x5 days ?-Diflucan (fluconazole) if you develop thick white discharge and external vaginal itching ?-We have sent testing for sexually transmitted infections. We will notify you of any positive results once they are received. If required, we will prescribe any medications you might need. Please refrain from all sexual activity until treatment is complete.  ?-Seek additional medical attention if you develop fevers/chills, new/worsening abdominal pain, new/worsening vaginal discomfort/discharge, etc.  ? ? ? ?ED Prescriptions   ? ? Medication Sig Dispense Auth. Provider  ? metroNIDAZOLE (METROGEL VAGINAL) 0.75 % vaginal gel Place 1 Applicatorful vaginally at bedtime for 5 days. 70 g Hazel Sams, PA-C  ? fluconazole (DIFLUCAN) 150 MG tablet Take 1 tablet (150 mg total) by mouth daily. 1 tablet Hazel Sams, PA-C  ? ?  ? ?PDMP not reviewed this encounter. ?  ?Hazel Sams, PA-C ?05/21/21 1549 ? ?

## 2021-05-23 LAB — CERVICOVAGINAL ANCILLARY ONLY
Bacterial Vaginitis (gardnerella): POSITIVE — AB
Candida Glabrata: NEGATIVE
Candida Vaginitis: NEGATIVE
Chlamydia: NEGATIVE
Comment: NEGATIVE
Comment: NEGATIVE
Comment: NEGATIVE
Comment: NEGATIVE
Comment: NEGATIVE
Comment: NORMAL
Neisseria Gonorrhea: NEGATIVE
Trichomonas: NEGATIVE

## 2021-07-16 ENCOUNTER — Encounter: Payer: Self-pay | Admitting: Emergency Medicine

## 2021-07-16 ENCOUNTER — Ambulatory Visit
Admission: EM | Admit: 2021-07-16 | Discharge: 2021-07-16 | Disposition: A | Discharge status: Home / Self Care | Payer: Medicaid Other | Attending: Family Medicine | Admitting: Family Medicine

## 2021-07-16 DIAGNOSIS — H6121 Impacted cerumen, right ear: Secondary | ICD-10-CM

## 2021-07-16 DIAGNOSIS — H66002 Acute suppurative otitis media without spontaneous rupture of ear drum, left ear: Secondary | ICD-10-CM | POA: Diagnosis not present

## 2021-07-16 MED ORDER — AMOXICILLIN-POT CLAVULANATE 875-125 MG PO TABS: 1.0000 | ORAL_TABLET | Freq: Two times a day (BID) | ORAL | 0 refills | Status: DC

## 2021-07-16 MED ORDER — FLUCONAZOLE 150 MG PO TABS: 150.0000 mg | ORAL_TABLET | ORAL | 0 refills | Status: DC | PRN

## 2021-07-16 NOTE — ED Triage Notes (Signed)
Pt presents with left ear pain and right ear fullness x 4 days.

## 2021-07-16 NOTE — ED Provider Notes (Signed)
[name]    CSN: [id number] Arrival date & time: 07/16/21  1244      History   Chief Complaint Chief Complaint  Patient presents with   Otalgia    HPI Leah [name] is a 21 y.o. female.   HPI Patient presents for evaluation of left ear pain and right ear fullness. Patient reports left ear pain has worsened over the last few days. Right ear hearing is diminished. She endorse a history of recurrent ear problems. History reviewed. No pertinent past medical history.  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 No pertinent surgical history.  OB History     Gravida  0   Para      Term      Preterm      AB      Living         SAB      IAB      Ectopic      Multiple      Live Births               Home Medications    Prior to Admission medications   Medication Sig Start Date End Date Taking? Authorizing Provider  amoxicillin-clavulanate (AUGMENTIN) 875-125 MG tablet Take 1 tablet by mouth 2 (two) times daily. 07/16/21  Yes Scot Jun, FNP  fluconazole (DIFLUCAN) 150 MG tablet Take 1 tablet (150 mg total) by mouth every three (3) days as needed. Repeat if needed 07/16/21  Yes Scot Jun, FNP  valACYclovir (VALTREX) 500 MG tablet Take by mouth. 05/02/21   [provider]    Family History Family History  Problem Relation Age of Onset   Cancer Mother        Acute Myloid    Depression Father    Drug abuse Father    Hypertension Father    Dementia Other    Early death Other    Heart disease Other    Hyperlipidemia Other    Intellectual disability Other    Diabetes Other    Depression Other    Sudden  death Other     Social History Social History   Tobacco Use   Smoking status: Every Day    Types: E-cigarettes   Smokeless tobacco: Never   Tobacco comments:    e-cig. often   Vaping Use   Vaping Use: Every day  Substance Use Topics   Alcohol use: Not Currently    Alcohol/week: 10.0 standard drinks    Types: 10 Standard drinks or equivalent per week   Drug use: Not Currently    Frequency: 1.0 times per week    Types: Marijuana     Allergies   Patient has no known allergies.   Review of Systems Review of Systems Pertinent negatives listed in HPI   Physical Exam Triage Vital Signs ED Triage Vitals [07/16/21 1257]  Enc Vitals Group     BP 95/67     Pulse Rate 90     Resp 16     Temp 98.6 F (37 C)     Temp Source Oral     SpO2 96 %     Weight  Height      Head Circumference      Peak Flow      Pain Score 8     Pain Loc      Pain Edu?      Excl. in [place]?    No data found.  Updated Vital Signs BP 95/67 (BP Location: Left Arm)   Pulse 90   Temp 98.6 F (37 C) (Oral)   Resp 16   LMP 06/26/2021   SpO2 96%   Visual Acuity Right Eye Distance:   Left Eye Distance:   Bilateral Distance:    Right Eye Near:   Left Eye Near:    Bilateral Near:     Physical Exam Vitals reviewed.  Constitutional:      Appearance: Normal appearance.  HENT:     Head: Normocephalic and atraumatic.     Right Ear: There is impacted cerumen.     Left Ear: Swelling and tenderness present. Tympanic membrane is erythematous and bulging.  Eyes:     Extraocular Movements: Extraocular movements intact.     Pupils: Pupils are equal, round, and reactive to light.  Cardiovascular:     Rate and Rhythm: Normal rate and regular rhythm.  Pulmonary:     Effort: Pulmonary effort is normal.     Breath sounds: Normal breath sounds.  Lymphadenopathy:     Cervical: No cervical adenopathy.  Skin:    General: Skin is warm and dry.  Neurological:     General: No focal deficit  present.     Mental Status: She is alert and oriented to person, place, and time.  Psychiatric:        Mood and Affect: Mood normal.        Behavior: Behavior normal.     UC Treatments / Results  Labs (all labs ordered are listed, but only abnormal results are displayed) Labs Reviewed - No data to display  EKG   Radiology No results found.  Procedures Procedures (including critical care time)  Medications Ordered in UC Medications - No data to display  Initial Impression / Assessment and Plan / UC Course  I have reviewed the triage vital signs and the nursing notes.  Pertinent labs & imaging results that were available during my care of the patient were reviewed by me and considered in my medical decision making (see chart for details).    Non-recurrent acute suppurative left ear w/o rupture of TM, tx with Augmentin BID x 10 days and Diflucan for yeast prevention Hearing loss due to cerumen impaction, RN irrigated right ear, cerumen removed. Return if symptoms worsen or do not improve.  Final Clinical Impressions(s) / UC Diagnoses   Final diagnoses:  Non-recurrent acute suppurative otitis media of left ear without spontaneous rupture of tympanic membrane  Hearing loss due to cerumen impaction, right   Discharge Instructions   None    ED Prescriptions     Medication Sig Dispense Auth. Provider   amoxicillin-clavulanate (AUGMENTIN) 875-125 MG tablet Take 1 tablet by mouth 2 (two) times daily. 20 tablet Scot Jun, FNP   fluconazole (DIFLUCAN) 150 MG tablet Take 1 tablet (150 mg total) by mouth every three (3) days as needed. Repeat if needed 2 tablet Scot Jun, FNP      PDMP not reviewed this encounter.   Scot Jun, FNP 07/16/21 1409

## 2021-09-09 ENCOUNTER — Ambulatory Visit
Admission: EM | Admit: 2021-09-09 | Discharge: 2021-09-09 | Disposition: A | Discharge status: Home / Self Care | Payer: Medicaid Other | Attending: Emergency Medicine | Admitting: Emergency Medicine

## 2021-09-09 DIAGNOSIS — N898 Other specified noninflammatory disorders of vagina: Secondary | ICD-10-CM | POA: Insufficient documentation

## 2021-09-09 HISTORY — DX: Herpesviral infection of urogenital system, unspecified: A60.00

## 2021-09-09 LAB — POCT URINE PREGNANCY: Preg Test, Ur: NEGATIVE

## 2021-09-09 LAB — POCT URINALYSIS DIP (MANUAL ENTRY)
Bilirubin, UA: NEGATIVE
Blood, UA: NEGATIVE
Glucose, UA: NEGATIVE mg/dL
Ketones, POC UA: NEGATIVE mg/dL
Leukocytes, UA: NEGATIVE
Nitrite, UA: NEGATIVE
Protein Ur, POC: NEGATIVE mg/dL
Spec Grav, UA: >=1.03 — AB (ref 1.010–1.025)
Urobilinogen, UA: 0.2 E.U./dL
pH, UA: 5.5 (ref 5.0–8.0)

## 2021-09-09 MED ORDER — METRONIDAZOLE 0.75 % VA GEL: 1.0000 | Freq: Every day | VAGINAL | 0 refills | Status: DC

## 2021-09-09 NOTE — Discharge Instructions (Addendum)
Today you are being treated prophylactically for  Bacterial vaginosis   Take Metrogel every evening for 7 days  Urinalysis is negative for bladder infection, urine pregnancy test is negative  Bacterial vaginosis which results from an overgrowth of one on several organisms that are normally present in your vagina. Vaginosis is an inflammation of the vagina that can result in discharge, itching and pain.  Labs pending you will be contacted if positive for any sti and treatment will be sent to the pharmacy, you will have to return to the clinic if positive for gonorrhea to receive treatment   Please refrain from having sex until labs results, if positive please refrain from having sex until treatment complete and symptoms resolve   If positive for  Chlamydia  gonorrhea or trichomoniasis please notify partner or partners so they may tested as well  Moving forward, it is recommended you use some form of protection against the transmission of sti infections  such as condoms or dental dams with each sexual encounter     In addition: Avoid baths, hot tubs and whirlpool spas.  Don't use scented or harsh soaps Avoid irritants. These include scented tampons and pads. Wipe from front to back after using the toilet. Don't douche. Your vagina doesn't require cleansing other than normal bathing.  Use a condom.  Wear cotton underwear, this fabric absorbs some moisture.

## 2021-09-09 NOTE — ED Provider Notes (Signed)
Leah [name]    CSN: [id number] Arrival date & time: 09/09/21  1236      History   Chief Complaint No chief complaint on file.   HPI Leah [name] is a 21 y.o. female.   Patient presents with Leah [name] thin vaginal discharge with odor, mild vaginal itching, urinary frequency, urgency, dysuria, lower abdominal pressure for 4 days.  Has not attempted treatment of symptoms.  Sexually active, 1 partner, no known exposure, no pregnancies.  Last menstrual period approximately 08/09/2021.  Denies any rash or lesions, hematuria, flank pain.  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 Authorizing Provider  metroNIDAZOLE (METROGEL VAGINAL) 0.75 % vaginal gel Place 1 Applicatorful vaginally at bedtime. 09/09/21  Yes Hans Eden, NP  valACYclovir (VALTREX) 500 MG tablet Take by mouth. 05/02/21  Yes [provider]    Family History Family History  Problem Relation Age of Onset   Cancer Mother        Acute Myloid    Depression Father    Drug abuse Father    Hypertension Father    Dementia Other    Early death Other    Heart disease Other    Hyperlipidemia Other    Intellectual disability Other    Diabetes Other    Depression Other    Sudden death Other      Social History Social History   Tobacco Use   Smoking status: Every Day    Types: E-cigarettes   Smokeless tobacco: Never   Tobacco comments:    e-cig. often   Vaping Use   Vaping Use: Every day   Substances: Nicotine, Flavoring  Substance Use Topics   Alcohol use: Not Currently    Comment: occ   Drug use: Not Currently    Frequency: 4.0 times per week    Types: Marijuana     Allergies   Patient has no known allergies.   Review of Systems Review of Systems  Constitutional: Negative.   HENT: Negative.    Respiratory: Negative.    Genitourinary:  Positive for dysuria, frequency, pelvic pain, urgency and vaginal discharge. Negative for decreased urine volume, difficulty urinating, dyspareunia, enuresis, flank pain, genital sores, hematuria, menstrual problem, vaginal bleeding and vaginal pain.     Physical Exam Triage Vital Signs ED Triage Vitals  Enc Vitals Group     BP 09/09/21 1301 124/83     Pulse Rate 09/09/21 1301 89  Resp 09/09/21 1301 16     Temp 09/09/21 1301 99.3 F (37.4 C)     Temp Source 09/09/21 1301 Oral     SpO2 09/09/21 1301 96 %     Weight --      Height --      Head Circumference --      Peak Flow --      Pain Score 09/09/21 1303 0     Pain Loc --      Pain Edu? --      Excl. in [place]? --    No data found.  Updated Vital Signs BP 124/83 (BP Location: Left Arm)   Pulse 89   Temp 99.3 F (37.4 C) (Oral)   Resp 16   LMP 08/09/2021 (Approximate)   SpO2 96%   Visual Acuity Right Eye Distance:   Left Eye Distance:   Bilateral Distance:    Right Eye Near:   Left Eye Near:    Bilateral Near:     Physical Exam Constitutional:      Appearance: Normal appearance.  HENT:     Head: Normocephalic.  Eyes:     Extraocular Movements: Extraocular movements intact.  Pulmonary:     Effort: Pulmonary effort is normal.  Abdominal:     General: Abdomen is flat. Bowel sounds are normal.     Palpations: Abdomen is soft.      Tenderness: There is abdominal tenderness in the suprapubic area.  Skin:    General: Skin is warm and dry.  Neurological:     Mental Status: She is alert and oriented to person, place, and time. Mental status is at baseline.  Psychiatric:        Mood and Affect: Mood normal.        Behavior: Behavior normal.      UC Treatments / Results  Labs (all labs ordered are listed, but only abnormal results are displayed) Labs Reviewed  POCT URINALYSIS DIP (MANUAL ENTRY) - Abnormal; Notable for the following components:      Result Value   Clarity, UA cloudy (*)    Spec Grav, UA >=1.030 (*)    All other components within normal limits  URINE CULTURE  POCT URINE PREGNANCY  CERVICOVAGINAL ANCILLARY ONLY    EKG   Radiology No results found.  Procedures Procedures (including critical care time)  Medications Ordered in UC Medications - No data to display  Initial Impression / Assessment and Plan / UC Course  I have reviewed the triage vital signs and the nursing notes.  Pertinent labs & imaging results that were available during my care of the patient were reviewed by me and considered in my medical decision making (see chart for details).  Vaginal discharge  Urinalysis negative, urine pregnancy negative, sent for culture, symptomology is consistent with bacterial vaginosis, will prophylactically treat, MetroGel prescribed, STI labs are pending, will treat further per protocol, advised abstinence until labs results, treatment is complete and all symptoms have resolved, may follow-up with his urgent care Final Clinical Impressions(s) / UC Diagnoses   Final diagnoses:  Vaginal discharge     Discharge Instructions      Today you are being treated prophylactically for  Bacterial vaginosis   Take Metrogel every evening for 7 days  Urinalysis is negative for bladder infection, urine pregnancy test is negative  Bacterial vaginosis which results from an overgrowth of one on  several organisms that are normally present in your vagina. Vaginosis is an inflammation of the vagina  that can result in discharge, itching and pain.  Labs pending you will be contacted if positive for any sti and treatment will be sent to the pharmacy, you will have to return to the clinic if positive for gonorrhea to receive treatment   Please refrain from having sex until labs results, if positive please refrain from having sex until treatment complete and symptoms resolve   If positive for  Chlamydia  gonorrhea or trichomoniasis please notify partner or partners so they may tested as well  Moving forward, it is recommended you use some form of protection against the transmission of sti infections  such as condoms or dental dams with each sexual encounter     In addition: Avoid baths, hot tubs and whirlpool spas.  Don't use scented or harsh soaps Avoid irritants. These include scented tampons and pads. Wipe from front to back after using the toilet. Don't douche. Your vagina doesn't require cleansing other than normal bathing.  Use a condom.  Wear cotton underwear, this fabric absorbs some moisture.        ED Prescriptions     Medication Sig Dispense Auth. Provider   metroNIDAZOLE (METROGEL VAGINAL) 0.75 % vaginal gel Place 1 Applicatorful vaginally at bedtime. 70 g Hans Eden, NP      PDMP not reviewed this encounter.   Hans Eden, NP 09/09/21 1322

## 2021-09-09 NOTE — ED Triage Notes (Signed)
Pt presents with urinary frequency and L groin cramping x 4 days.  Reports vaginal discharge - white, milky and mucousy with odor.  Denies vaginal rash.   Having unprotected intercourse.

## 2021-09-10 LAB — URINE CULTURE

## 2021-09-10 LAB — CERVICOVAGINAL ANCILLARY ONLY
Bacterial Vaginitis (gardnerella): NEGATIVE
Candida Glabrata: NEGATIVE
Candida Vaginitis: NEGATIVE
Chlamydia: NEGATIVE
Comment: NEGATIVE
Comment: NEGATIVE
Comment: NEGATIVE
Comment: NEGATIVE
Comment: NEGATIVE
Comment: NORMAL
Neisseria Gonorrhea: NEGATIVE
Trichomonas: NEGATIVE

## 2022-03-21 ENCOUNTER — Ambulatory Visit
Admission: EM | Admit: 2022-03-21 | Discharge: 2022-03-21 | Disposition: A | Discharge status: Home / Self Care | Payer: Medicaid Other | Attending: Emergency Medicine | Admitting: Emergency Medicine

## 2022-03-21 DIAGNOSIS — N898 Other specified noninflammatory disorders of vagina: Secondary | ICD-10-CM | POA: Diagnosis not present

## 2022-03-21 DIAGNOSIS — R350 Frequency of micturition: Secondary | ICD-10-CM | POA: Insufficient documentation

## 2022-03-21 DIAGNOSIS — Z3202 Encounter for pregnancy test, result negative: Secondary | ICD-10-CM | POA: Diagnosis not present

## 2022-03-21 LAB — POCT URINE PREGNANCY: Preg Test, Ur: NEGATIVE

## 2022-03-21 LAB — POCT URINALYSIS DIP (MANUAL ENTRY)
Bilirubin, UA: NEGATIVE
Blood, UA: NEGATIVE
Glucose, UA: NEGATIVE mg/dL
Ketones, POC UA: NEGATIVE mg/dL
Leukocytes, UA: NEGATIVE
Nitrite, UA: NEGATIVE
Protein Ur, POC: NEGATIVE mg/dL
Spec Grav, UA: >=1.03 — AB (ref 1.010–1.025)
Urobilinogen, UA: 0.2 E.U./dL
pH, UA: 5.5 (ref 5.0–8.0)

## 2022-03-21 NOTE — ED Provider Notes (Signed)
[name]    CSN: [id number] Arrival date & time: 03/21/22  1423      History   Chief Complaint Chief Complaint  Patient presents with   SEXUALLY TRANSMITTED DISEASE    HPI Leah [name] is a 22 y.o. female.  Patient presents with 3-day history of malodorous vaginal discharge, vaginal itching, urinary frequency.  Patient reports negative STD check on 03/14/2022 but was positive for bacterial vaginitis.  She was prescribed Metro Gel but has not started it yet.  She has had intercourse since then and is concerned for possible STD.  She requests STD check today.  She denies fever, abdominal pain, dysuria, hematuria, pelvic pain, rash, or other symptoms.  Her medical history includes HSV.  The history is provided by the patient and medical records.    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 Authorizing Provider  metroNIDAZOLE (METROGEL VAGINAL) 0.75 % vaginal gel Place 1 Applicatorful vaginally at bedtime. 09/09/21   Hans Eden, NP  valACYclovir (VALTREX) 500 MG tablet Take by mouth. 05/02/21   [provider]    Family History Family History  Problem Relation Age of Onset   Cancer Mother        Acute Myloid    Depression Father    Drug  abuse Father    Hypertension Father    Dementia Other    Early death Other    Heart disease Other    Hyperlipidemia Other    Intellectual disability Other    Diabetes Other    Depression Other    Sudden death Other     Social History Social History   Tobacco Use   Smoking status: Every Day    Types: E-cigarettes   Smokeless tobacco: Never   Tobacco comments:    e-cig. often   Vaping Use   Vaping Use: Every day   Substances: Nicotine, Flavoring  Substance Use Topics   Alcohol use: Not Currently    Comment: occ   Drug use: Not Currently    Frequency: 4.0 times per week    Types: Marijuana     Allergies   Patient has no known allergies.   Review of Systems Review of Systems  Constitutional:  Negative for chills and fever.  Gastrointestinal:  Negative for abdominal pain, nausea and vomiting.  Genitourinary:  Positive for frequency and vaginal discharge. Negative for dysuria, flank pain, hematuria and pelvic pain.  Skin:  Negative for color change and rash.  All other systems reviewed and are negative.    Physical Exam Triage Vital Signs ED Triage Vitals  Enc Vitals Group     BP 03/21/22 1452 115/79     Pulse Rate 03/21/22 1452 89     Resp 03/21/22 1452 18     Temp 03/21/22 1452 98 F (36.7 C)     Temp src --      SpO2 03/21/22 1452 98 %     Weight --      Height --      Head Circumference --      Peak Flow --      Pain Score 03/21/22 1451 0     Pain Loc --      Pain Edu? --      Excl. in [place]? --    No data found.  Updated Vital Signs BP 115/79   Pulse 89   Temp 98 F (36.7 C)   Resp 18   SpO2 98%   Visual Acuity Right Eye Distance:   Left Eye Distance:   Bilateral Distance:    Right Eye Near:   Left Eye Near:    Bilateral Near:     Physical Exam Vitals and nursing note reviewed.  Constitutional:      General: She is not in acute distress.    Appearance: Normal appearance. She is well-developed. She is not ill-appearing.  HENT:      Mouth/Throat:     Mouth: Mucous membranes are moist.  Cardiovascular:     Rate and Rhythm: Normal rate and regular rhythm.  Pulmonary:     Effort: Pulmonary effort is normal. No respiratory distress.  Abdominal:     General: Bowel sounds are normal.     Palpations: Abdomen is soft.     Tenderness: There is no abdominal tenderness. There is no right CVA tenderness, left CVA tenderness, guarding or rebound.  Musculoskeletal:     Cervical back: Neck supple.  Skin:    General: Skin is warm and dry.  Neurological:     Mental Status: She is alert.  Psychiatric:        Mood and Affect: Mood normal.        Behavior: Behavior normal.      UC Treatments / Results  Labs (all labs ordered are listed, but only abnormal results are displayed) Labs Reviewed  POCT URINALYSIS DIP (MANUAL ENTRY) - Abnormal; Notable for the following components:      Result Value   Spec Grav, UA >=1.030 (*)    All other components within normal limits  POCT URINE PREGNANCY  CERVICOVAGINAL ANCILLARY ONLY    EKG   Radiology No results found.  Procedures Procedures (including critical care time)  Medications Ordered in UC Medications - No data to display  Initial Impression / Assessment and Plan / UC Course  I have reviewed the triage vital signs and the nursing notes.  Pertinent labs & imaging results that were available during my care of the patient were reviewed by me and considered in my medical decision making (see chart for details).    Vaginal discharge, vaginal itching, urinary frequency, negative pregnancy test.  Urine does not show signs of infection.  Urine pregnancy negative.  Instructed patient to start the MetroGel as prescribed by her doctor.  She obtained vaginal self swab for testing.  Discussed that we will call if test results are positive.  Discussed that she may require treatment at that  time.  Discussed that sexual partner(s) may also require treatment.  Instructed patient to  abstain from sexual activity for at least 7 days.  Instructed her to follow-up with her PCP or gynecologist if her symptoms are not improving.  Patient agrees to plan of care.   Final Clinical Impressions(s) / UC Diagnoses   Final diagnoses:  Vaginal discharge  Vaginal itching  Urinary frequency  Negative pregnancy test     Discharge Instructions      Use the MetroGel as prescribed by your doctor.   Your vaginal tests are pending.  If your test results are positive, we will call you.  You and your sexual partner(s) may require treatment at that time.  Do not have sexual activity for at least 7 days.         ED Prescriptions   None    PDMP not reviewed this encounter.   Sharion Balloon, NP 03/21/22 [phone]

## 2022-03-21 NOTE — ED Triage Notes (Signed)
Patient to Urgent Care with complaints of malodorous vaginal discharge. Reports some vaginal itching and urinary frequency. Symptoms started 3 days ago.   Patient was swabbed for BV at her pcp office 2/2. Patient was swabbed for STDs and all were negative, patient had intercourse with her boyfriend after the visit. Reports concerns for STD.

## 2022-03-21 NOTE — Discharge Instructions (Signed)
Use the MetroGel as prescribed by your doctor.   Your vaginal tests are pending.  If your test results are positive, we will call you.  You and your sexual partner(s) may require treatment at that time.  Do not have sexual activity for at least 7 days.

## 2022-03-24 LAB — CERVICOVAGINAL ANCILLARY ONLY
Bacterial Vaginitis (gardnerella): POSITIVE — AB
Candida Glabrata: NEGATIVE
Candida Vaginitis: NEGATIVE
Chlamydia: NEGATIVE
Comment: NEGATIVE
Comment: NEGATIVE
Comment: NEGATIVE
Comment: NEGATIVE
Comment: NEGATIVE
Comment: NORMAL
Neisseria Gonorrhea: NEGATIVE
Trichomonas: NEGATIVE

## 2022-05-13 ENCOUNTER — Ambulatory Visit
Admission: EM | Admit: 2022-05-13 | Discharge: 2022-05-13 | Disposition: A | Discharge status: Home / Self Care | Payer: Medicaid Other | Attending: Urgent Care | Admitting: Urgent Care

## 2022-05-13 DIAGNOSIS — R350 Frequency of micturition: Secondary | ICD-10-CM

## 2022-05-13 DIAGNOSIS — N898 Other specified noninflammatory disorders of vagina: Secondary | ICD-10-CM | POA: Diagnosis not present

## 2022-05-13 LAB — POCT URINALYSIS DIP (MANUAL ENTRY)
Bilirubin, UA: NEGATIVE
Blood, UA: NEGATIVE
Glucose, UA: NEGATIVE mg/dL
Ketones, POC UA: NEGATIVE mg/dL
Leukocytes, UA: NEGATIVE
Nitrite, UA: NEGATIVE
Protein Ur, POC: NEGATIVE mg/dL
Spec Grav, UA: 1.025 (ref 1.010–1.025)
Urobilinogen, UA: 0.2 E.U./dL
pH, UA: 6 (ref 5.0–8.0)

## 2022-05-13 LAB — POCT URINE PREGNANCY: Preg Test, Ur: NEGATIVE

## 2022-05-13 NOTE — Discharge Instructions (Addendum)
Follow up here or with your primary care provider if your symptoms are worsening or not improving.     

## 2022-05-13 NOTE — ED Provider Notes (Addendum)
[name]    CSN: [id number] Arrival date & time: 05/13/22  1215      History   Chief Complaint Chief Complaint  Patient presents with   Urinary Frequency    HPI LYNETTE PETTIT is a 22 y.o. female.    Urinary Frequency    Presents with complaint of suprapubic pain and urinary frequency.  Also reports malodorous vaginal discharge and itchiness.  Symptoms started yesterday.  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 Authorizing Provider  metroNIDAZOLE (METROGEL VAGINAL) 0.75 % vaginal gel Place 1 Applicatorful vaginally at bedtime. Patient not taking: Reported on 05/13/2022 09/09/21   Hans Eden, NP  valACYclovir (VALTREX) 500 MG tablet Take by mouth. 05/02/21   [provider]    Family History Family History  Problem Relation Age of Onset   Cancer Mother        Acute Myloid    Depression Father    Drug abuse Father    Hypertension Father    Dementia Other    Early death Other    Heart disease Other    Hyperlipidemia Other    Intellectual disability Other    Diabetes Other    Depression Other    Sudden death Other     Social History Social History   Tobacco Use   Smoking status: Every Day    Types: E-cigarettes   Smokeless  tobacco: Never   Tobacco comments:    e-cig. often   Vaping Use   Vaping Use: Every day   Substances: Nicotine, Flavoring  Substance Use Topics   Alcohol use: Not Currently    Comment: occ   Drug use: Not Currently    Frequency: 4.0 times per week    Types: Marijuana     Allergies   Patient has no known allergies.   Review of Systems Review of Systems  Genitourinary:  Positive for frequency.     Physical Exam Triage Vital Signs ED Triage Vitals  Enc Vitals Group     BP 05/13/22 1256 118/83     Pulse Rate 05/13/22 1256 100     Resp 05/13/22 1256 18     Temp 05/13/22 1256 98.1 F (36.7 C)     Temp src --      SpO2 05/13/22 1256 96 %     Weight 05/13/22 1305 195 lb (88.5 kg)     Height 05/13/22 1305 5\' 6"  (1.676 m)  Head Circumference --      Peak Flow --      Pain Score 05/13/22 1304 3     Pain Loc --      Pain Edu? --      Excl. in [place]? --    No data found.  Updated Vital Signs BP 118/83   Pulse 100   Temp 98.1 F (36.7 C)   Resp 18   Ht 5\' 6"  (1.676 m)   Wt 195 lb (88.5 kg)   LMP 04/26/2022   SpO2 96%   BMI 31.47 kg/m   Visual Acuity Right Eye Distance:   Left Eye Distance:   Bilateral Distance:    Right Eye Near:   Left Eye Near:    Bilateral Near:     Physical Exam Vitals reviewed.  Constitutional:      Appearance: Normal appearance.  Skin:    General: Skin is warm and dry.  Neurological:     General: No focal deficit present.     Mental Status: She is alert and oriented to person, place, and time.  Psychiatric:        Mood and Affect: Mood normal.        Behavior: Behavior normal.      UC Treatments / Results  Labs (all labs ordered are listed, but only abnormal results are displayed) Labs Reviewed  POCT URINE PREGNANCY  POCT URINALYSIS DIP (MANUAL ENTRY)  CERVICOVAGINAL ANCILLARY ONLY    EKG   Radiology No results found.  Procedures Procedures (including critical care time)  Medications Ordered in  UC Medications - No data to display  Initial Impression / Assessment and Plan / UC Course  I have reviewed the triage vital signs and the nursing notes.  Pertinent labs & imaging results that were available during my care of the patient were reviewed by me and considered in my medical decision making (see chart for details).   UA is nonsuggestive of urinary tract infection.  Possibly some dehydration given specific gravity of greater than 1.03.  Vaginal swab was obtained to evaluate vaginal discharge and results are pending.  Prescription of treatment if appropriate upon results of vaginal swab.  Patient requests prescription for metronidazole gel rather than oral if she is positive for BV.  Patient acknowledges understanding and agreement with this treatment plan.   Final Clinical Impressions(s) / UC Diagnoses   Final diagnoses:  Frequency of urination   Discharge Instructions   None    ED Prescriptions   None    PDMP not reviewed this encounter.   Rose Phi, [place] 05/13/22 1317    [place], [place] 05/13/22 1322

## 2022-05-13 NOTE — ED Triage Notes (Signed)
Patient to Urgent Care with complaints of suprapubic pain and urinary frequency. Reports symptoms started yesterday.   Has had some malodorous vaginal discharge and itchiness.

## 2022-05-14 ENCOUNTER — Telehealth (HOSPITAL_COMMUNITY): Payer: Self-pay

## 2022-05-14 LAB — CERVICOVAGINAL ANCILLARY ONLY
Bacterial Vaginitis (gardnerella): POSITIVE — AB
Candida Glabrata: NEGATIVE
Candida Vaginitis: NEGATIVE
Chlamydia: NEGATIVE
Comment: NEGATIVE
Comment: NEGATIVE
Comment: NEGATIVE
Comment: NEGATIVE
Comment: NEGATIVE
Comment: NORMAL
Neisseria Gonorrhea: NEGATIVE
Trichomonas: NEGATIVE

## 2022-05-14 MED ORDER — METRONIDAZOLE 500 MG PO TABS: 500.0000 mg | ORAL_TABLET | Freq: Two times a day (BID) | ORAL | 0 refills | Status: DC

## 2022-05-23 ENCOUNTER — Telehealth: Payer: Self-pay | Admitting: Urgent Care

## 2022-05-23 DIAGNOSIS — N760 Acute vaginitis: Secondary | ICD-10-CM

## 2022-05-23 MED ORDER — METRONIDAZOLE 1.3 % VA GEL: 1.0000 | Freq: Every evening | VAGINAL | 0 refills | Status: AC

## 2022-05-23 NOTE — Telephone Encounter (Signed)
Sent metronidazole gel to replace tablets ordered by on-call RN.

## 2022-06-21 ENCOUNTER — Encounter (HOSPITAL_COMMUNITY): Payer: Self-pay | Admitting: Emergency Medicine

## 2022-06-21 ENCOUNTER — Ambulatory Visit (HOSPITAL_COMMUNITY)
Admission: EM | Admit: 2022-06-21 | Discharge: 2022-06-21 | Disposition: A | Discharge status: Home / Self Care | Payer: Medicaid Other | Attending: Emergency Medicine | Admitting: Emergency Medicine

## 2022-06-21 DIAGNOSIS — R350 Frequency of micturition: Secondary | ICD-10-CM | POA: Diagnosis not present

## 2022-06-21 DIAGNOSIS — N898 Other specified noninflammatory disorders of vagina: Secondary | ICD-10-CM | POA: Diagnosis present

## 2022-06-21 LAB — POCT URINALYSIS DIP (MANUAL ENTRY)
Bilirubin, UA: NEGATIVE
Glucose, UA: NEGATIVE mg/dL
Ketones, POC UA: NEGATIVE mg/dL
Nitrite, UA: NEGATIVE
Protein Ur, POC: 100 mg/dL — AB
Spec Grav, UA: 1.025 (ref 1.010–1.025)
Urobilinogen, UA: 0.2 E.U./dL
pH, UA: 6 (ref 5.0–8.0)

## 2022-06-21 LAB — POCT URINE PREGNANCY: Preg Test, Ur: NEGATIVE

## 2022-06-21 MED ORDER — METRONIDAZOLE 0.75 % VA GEL: 1.0000 | Freq: Every day | VAGINAL | 0 refills | Status: DC

## 2022-06-21 NOTE — ED Provider Notes (Signed)
[facility]    CSN: [id number] Arrival date & time: 06/21/22  1719      History   Chief Complaint Chief Complaint  Patient presents with   Dysuria   Back Pain    HPI Leah [name] is a 22 y.o. female.   Patient presents for evaluation of intermittent lower abdominal pain and Leah [name] thin vaginal discharge present for 7 days, begin to experience bilateral lower back pain and dysuria 3 days ago.  Has tempted use of Tylenol which has been ineffective.  Sexually active, 1 partner, no condom use, requesting STI testing.  Last menstrual period 06/06/2022.  Denies hematuria, vaginal odor or itching.     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 Authorizing Provider  valACYclovir (VALTREX) 500 MG tablet Take by mouth. 05/02/21  Yes [provider]    Family History Family History  Problem Relation Age of Onset   Cancer Mother        Acute Myloid    Depression Father    Drug abuse Father    Hypertension Father    Dementia Other    Early death Other    Heart disease Other    Hyperlipidemia Other    Intellectual disability Other    Diabetes Other    Depression Other    Sudden death Other     Social History Social History   Tobacco Use    Smoking status: Every Day    Types: E-cigarettes   Smokeless tobacco: Never   Tobacco comments:    e-cig. often   Vaping Use   Vaping Use: Every day   Substances: Nicotine, Flavoring  Substance Use Topics   Alcohol use: Not Currently    Comment: occ   Drug use: Not Currently    Frequency: 4.0 times per week    Types: Marijuana     Allergies   Patient has no known allergies.   Review of Systems Review of Systems  Constitutional: Negative.   HENT: Negative.    Respiratory: Negative.    Cardiovascular: Negative.   Genitourinary:  Positive for dysuria, frequency, urgency and vaginal discharge. Negative for decreased urine volume, difficulty urinating, dyspareunia, enuresis, flank pain, genital sores, hematuria, menstrual problem, pelvic pain, vaginal bleeding and vaginal pain.  Musculoskeletal:  Positive for back pain. Negative for arthralgias, gait problem, joint swelling, myalgias, neck pain and neck stiffness.     Physical Exam Triage Vital Signs ED  Triage Vitals [06/21/22 1734]  Enc Vitals Group     BP 123/78     Pulse Rate 97     Resp 16     Temp 98.2 F (36.8 C)     Temp Source Oral     SpO2 97 %     Weight      Height      Head Circumference      Peak Flow      Pain Score      Pain Loc      Pain Edu?      Excl. in [place]?    No data found.  Updated Vital Signs BP 123/78 (BP Location: Right Arm)   Pulse 97   Temp 98.2 F (36.8 C) (Oral)   Resp 16   LMP 06/06/2022   SpO2 97%   Visual Acuity Right Eye Distance:   Left Eye Distance:   Bilateral Distance:    Right Eye Near:   Left Eye Near:    Bilateral Near:     Physical Exam Constitutional:      Appearance: Normal appearance.  Eyes:     Extraocular Movements: Extraocular movements intact.  Pulmonary:     Effort: Pulmonary effort is normal.  Abdominal:     General: Abdomen is flat. Bowel sounds are normal.     Palpations: Abdomen is soft.     Tenderness: There is abdominal tenderness in the  suprapubic area. There is no right CVA tenderness or left CVA tenderness.  Skin:    General: Skin is warm and dry.  Neurological:     Mental Status: She is alert and oriented to person, place, and time. Mental status is at baseline.      UC Treatments / Results  Labs (all labs ordered are listed, but only abnormal results are displayed) Labs Reviewed  POCT URINALYSIS DIP (MANUAL ENTRY) - Abnormal; Notable for the following components:      Result Value   Blood, UA large (*)    Protein Ur, POC =100 (*)    Leukocytes, UA Moderate (2+) (*)    All other components within normal limits  POCT URINE PREGNANCY  CERVICOVAGINAL ANCILLARY ONLY    EKG   Radiology No results found.  Procedures Procedures (including critical care time)  Medications Ordered in UC Medications - No data to display  Initial Impression / Assessment and Plan / UC Course  I have reviewed the triage vital signs and the nursing notes.  Pertinent labs & imaging results that were available during my care of the patient were reviewed by me and considered in my medical decision making (see chart for details).  Vaginal discharge, urinary frequency  Urinalysis showing leukocytes, negative for nitrates, sent for culture, urine pregnancy is negative, treating prophylactically for bacterial vaginosis based on history and symptomology, MetroGel prescribed, remaining STI labs pending, will treat further per protocol, advised abstinence until lab results, treatment is complete and all symptoms have resolved, advised condom use during all sexual encounters moving forward, may follow-up with his urgent care as needed Final Clinical Impressions(s) / UC Diagnoses   Final diagnoses:  None     Discharge Instructions      Urinalysis is   Urine pregnancy is    Labs pending 2-3 days, you will be contacted if positive for any sti and treatment will be sent to the pharmacy, you will have to return to the clinic if  positive for gonorrhea to receive treatment   Please refrain  from having sex until labs results, if positive please refrain from having sex until treatment complete and symptoms resolve   If positive for Chlamydia  gonorrhea or trichomoniasis please notify partner or partners so they may tested as well  Moving forward, it is recommended you use some form of protection against the transmission of sti infections  such as condoms or dental dams with each sexual encounter     ED Prescriptions   None    PDMP not reviewed this encounter.   Valinda Hoar, NP 06/22/22 1026

## 2022-06-21 NOTE — ED Triage Notes (Addendum)
Dysuria with left sided lower abdominal pain x 1 week, back pain today. Also reports some occasional constipation. Taking tylenol at home without improvement. Requesting UTI and STD rule out, only swab no bloodwork. Reports new mild weakness and fatigue. Denies fevers, chills, CP, SOB, N/V/D. Reports having unprotected sex and not being on birth control currently. LMP 2 weeks prior

## 2022-06-21 NOTE — Discharge Instructions (Addendum)
Urinalysis is negative for infection, shows Amada Hallisey blood cells but does not show nitrates with the enzyme released for bacteria therefore it has been sent to the lab to determine if bacteria will grow, you will be notified if this occurs and new medication sent to pharmacy  Urine pregnancy is  negative   Today you are being treated prophylactically for bacterial vaginosis based on your symptoms and your history  Begin MetroGel every night before bed for 7 days  Labs pending 2-3 days, you will be contacted if positive for any sti and treatment will be sent to the pharmacy, you will have to return to the clinic if positive for gonorrhea to receive treatment   Please refrain from having sex until labs results, if positive please refrain from having sex until treatment complete and symptoms resolve   If positive for Chlamydia  gonorrhea or trichomoniasis please notify partner or partners so they may tested as well  Moving forward, it is recommended you use some form of protection against the transmission of sti infections  such as condoms or dental dams with each sexual encounter

## 2022-06-22 LAB — URINE CULTURE

## 2022-06-23 ENCOUNTER — Emergency Department (HOSPITAL_BASED_OUTPATIENT_CLINIC_OR_DEPARTMENT_OTHER)
Admission: EM | Admit: 2022-06-23 | Discharge: 2022-06-23 | Disposition: A | Discharge status: Home / Self Care | Payer: Medicaid Other | Attending: Emergency Medicine | Admitting: Emergency Medicine

## 2022-06-23 ENCOUNTER — Encounter (HOSPITAL_BASED_OUTPATIENT_CLINIC_OR_DEPARTMENT_OTHER): Payer: Self-pay

## 2022-06-23 ENCOUNTER — Other Ambulatory Visit: Payer: Self-pay

## 2022-06-23 ENCOUNTER — Emergency Department (HOSPITAL_BASED_OUTPATIENT_CLINIC_OR_DEPARTMENT_OTHER): Payer: Medicaid Other

## 2022-06-23 DIAGNOSIS — M549 Dorsalgia, unspecified: Secondary | ICD-10-CM | POA: Diagnosis not present

## 2022-06-23 DIAGNOSIS — R110 Nausea: Secondary | ICD-10-CM | POA: Insufficient documentation

## 2022-06-23 DIAGNOSIS — R109 Unspecified abdominal pain: Secondary | ICD-10-CM

## 2022-06-23 LAB — COMPREHENSIVE METABOLIC PANEL
ALT: 19 U/L (ref 0–44)
AST: 14 U/L — ABNORMAL LOW (ref 15–41)
Albumin: 4.7 g/dL (ref 3.5–5.0)
Alkaline Phosphatase: 95 U/L (ref 38–126)
Anion gap: 8 (ref 5–15)
BUN: 14 mg/dL (ref 6–20)
CO2: 26 mmol/L (ref 22–32)
Calcium: 9.5 mg/dL (ref 8.9–10.3)
Chloride: 107 mmol/L (ref 98–111)
Creatinine, Ser: 0.89 mg/dL (ref 0.44–1.00)
GFR, Estimated: >60 mL/min (ref >60)
Glucose, Bld: 102 mg/dL — ABNORMAL HIGH (ref 70–99)
Potassium: 3.8 mmol/L (ref 3.5–5.1)
Sodium: 141 mmol/L (ref 135–145)
Total Bilirubin: 0.4 mg/dL (ref 0.3–1.2)
Total Protein: 7.5 g/dL (ref 6.5–8.1)

## 2022-06-23 LAB — URINALYSIS, ROUTINE W REFLEX MICROSCOPIC
Bacteria, UA: NONE SEEN
Bilirubin Urine: NEGATIVE
Glucose, UA: NEGATIVE mg/dL
Hgb urine dipstick: NEGATIVE
Ketones, ur: NEGATIVE mg/dL
Nitrite: NEGATIVE
Specific Gravity, Urine: 1.021 (ref 1.005–1.030)
pH: 6 (ref 5.0–8.0)

## 2022-06-23 LAB — CBC
HCT: 41.3 % (ref 36.0–46.0)
Hemoglobin: 13.7 g/dL (ref 12.0–15.0)
MCH: 29.5 pg (ref 26.0–34.0)
MCHC: 33.2 g/dL (ref 30.0–36.0)
MCV: 89 fL (ref 80.0–100.0)
Platelets: 267 10*3/uL (ref 150–400)
RBC: 4.64 MIL/uL (ref 3.87–5.11)
RDW: 12.5 % (ref 11.5–15.5)
WBC: 8.8 10*3/uL (ref 4.0–10.5)
nRBC: 0 % (ref 0.0–0.2)

## 2022-06-23 LAB — CERVICOVAGINAL ANCILLARY ONLY
Bacterial Vaginitis (gardnerella): NEGATIVE
Candida Glabrata: NEGATIVE
Candida Vaginitis: NEGATIVE
Chlamydia: NEGATIVE
Comment: NEGATIVE
Comment: NEGATIVE
Comment: NEGATIVE
Comment: NEGATIVE
Comment: NEGATIVE
Comment: NORMAL
Neisseria Gonorrhea: NEGATIVE
Trichomonas: NEGATIVE

## 2022-06-23 LAB — PREGNANCY, URINE: Preg Test, Ur: NEGATIVE

## 2022-06-23 LAB — LIPASE, BLOOD: Lipase: 19 U/L (ref 11–51)

## 2022-06-23 MED ORDER — ONDANSETRON HCL 4 MG PO TABS: 4.0000 mg | ORAL_TABLET | Freq: Four times a day (QID) | ORAL | 0 refills | Status: DC

## 2022-06-23 NOTE — ED Provider Notes (Signed)
[place] EMERGENCY DEPARTMENT AT [facility] Provider Note   CSN: [id number] Arrival date & time: 06/23/22  1252     History  Chief Complaint  Patient presents with   Flank Pain    Leah [name] is a 22 y.o. female with past medical history significant for alcohol use disorder, adjustment disorder presents to the ED complaining of right-sided back pain and flank pain.  She states that the pain radiates from her right flank down to her right lower abdomen and has associated nausea.  She states the pain began around 3 AM today.  Pain has been constant.  She is also had some urinary frequency.  Patient has taken 800 mg of ibuprofen without relief.  Denies fever, chills, vomiting, diarrhea, constipation.       Home Medications Prior to Admission medications   Medication Sig Start Date End Date Taking? Authorizing Provider  ondansetron (ZOFRAN) 4 MG tablet Take 1 tablet (4 mg total) by mouth every 6 (six) hours. 06/23/22  Yes Wyland Rastetter R, PA-C  metroNIDAZOLE (METROGEL) 0.75 % vaginal gel Place 1 Applicatorful vaginally at bedtime. 06/21/22   Valinda Hoar, NP  valACYclovir (VALTREX) 500 MG tablet Take by mouth. 05/02/21   [provider]      Allergies    Patient has no known allergies.    Review of Systems   Review of Systems  Constitutional:  Negative for chills and fever.  Gastrointestinal:  Positive for abdominal pain and nausea. Negative for abdominal distention, constipation, diarrhea and vomiting.  Genitourinary:  Positive for flank pain.  Musculoskeletal:  Positive for back pain.    Physical Exam Updated Vital Signs BP 95/67 (BP Location: Right Arm)   Pulse 74   Temp 98 F (36.7 C)   Resp 16   Ht 5\' 6"  (1.676 m)   Wt 86.2 kg   LMP 06/06/2022   SpO2 100%   BMI 30.67 kg/m  Physical Exam Vitals and nursing note reviewed.  Constitutional:      General: She is not in acute distress.    Appearance: Normal appearance. She is not  ill-appearing or diaphoretic.  Cardiovascular:     Rate and Rhythm: Normal rate and regular rhythm.  Pulmonary:     Effort: Pulmonary effort is normal.  Abdominal:     General: Abdomen is flat. Bowel sounds are normal.     Palpations: Abdomen is soft.     Tenderness: There is no abdominal tenderness. There is no right CVA tenderness, left CVA tenderness, guarding or rebound. Negative signs include McBurney's sign.     Hernia: No hernia is present.  Skin:    General: Skin is warm and dry.     Capillary Refill: Capillary refill takes less than 2 seconds.     Findings: No rash.  Neurological:     Mental Status: She is alert. Mental status is at baseline.  Psychiatric:        Mood and Affect: Mood normal.        Behavior: Behavior normal.     ED Results / Procedures / Treatments   Labs (all labs ordered are listed, but only abnormal results are displayed) Labs Reviewed  COMPREHENSIVE METABOLIC PANEL - Abnormal; Notable for the following components:      Result Value   Glucose, Bld 102 (*)    AST 14 (*)    All other components within normal limits  URINALYSIS, ROUTINE W REFLEX MICROSCOPIC - Abnormal; Notable for the following components:  Protein, ur TRACE (*)    Leukocytes,Ua TRACE (*)    All other components within normal limits  LIPASE, BLOOD  CBC  PREGNANCY, URINE    EKG None  Radiology CT Renal Stone Study  Result Date: 06/23/2022 CLINICAL DATA:  Abdominal and flank pain with stone suspected. Some frequency. Right flank pain and right abdominal pain. EXAM: CT ABDOMEN AND PELVIS WITHOUT CONTRAST TECHNIQUE: Multidetector CT imaging of the abdomen and pelvis was performed following the standard protocol without IV contrast. RADIATION DOSE REDUCTION: This exam was performed according to the departmental dose-optimization program which includes automated exposure control, adjustment of the mA and/or kV according to patient size and/or use of iterative reconstruction  technique. COMPARISON:  06/17/2018 and 07/20/2020 FINDINGS: Lower chest: The lung bases are clear. Hepatobiliary: No focal liver abnormality is seen. No gallstones, gallbladder wall thickening, or biliary dilatation. Pancreas: Unremarkable. No pancreatic ductal dilatation or surrounding inflammatory changes. Spleen: Normal in size without focal abnormality. Adrenals/Urinary Tract: Adrenal glands are unremarkable. Kidneys are normal, without renal calculi, focal lesion, or hydronephrosis. Bladder is unremarkable. Stomach/Bowel: Stomach, small bowel, and colon are not abnormally distended. Scattered stool throughout the colon. No wall thickening or inflammatory changes identified. Appendix is normal. Vascular/Lymphatic: No significant vascular findings are present. No enlarged abdominal or pelvic lymph nodes. Reproductive: No pelvic masses. Other: No free air or free fluid in the abdomen. Abdominal wall musculature appears intact. Focal areas of fat necrosis are demonstrated in the gluteal region and peroneal regions bilaterally, unchanged since prior study. Musculoskeletal: No acute or significant osseous findings. IMPRESSION: 1. No renal or ureteral stone or obstruction. 2. No bowel obstruction or inflammation. 3. Focal areas of fat necrosis demonstrated in the gluteal regions, unchanged since prior study, possibly injection sites or small lipomas. Electronically Signed   By: Burman Nieves M.D.   On: 06/23/2022 15:55    Procedures Procedures    Medications Ordered in ED Medications - No data to display  ED Course/ Medical Decision Making/ A&P                             Medical Decision Making Amount and/or Complexity of Data Reviewed Labs: ordered.   This patient presents to the ED with chief complaint(s) of right-sided flank pain that radiates into the right abdomen with pertinent past medical history of previous kidney stone, alcohol use disorder.  The complaint involves an extensive  differential diagnosis and also carries with it a high risk of complications and morbidity.    The differential diagnosis includes kidney stone, ureterolithiasis, UTI, pyelonephritis  The initial plan is to obtain baseline labs  Initial Assessment:   Exam significant for overall well-appearing 22 year old female patient.  There is no abdominal tenderness on palpation.  No right CVA tenderness.  Abdomen soft and nondistended.  Skin is warm and dry with good color.  No appreciable rashes or hernias.  Independent ECG/labs interpretation:  The following labs were independently interpreted:  UA without evidence of infection.  Lipase not indicative of pancreatitis.  Metabolic panel without significant electrolyte derangement.  Hepatic and renal function are both normal.  Pregnancy test was negative.  CBC without leukocytosis or anemia.  Independent visualization and interpretation of imaging: I independently visualized the following imaging with scope of interpretation limited to determining acute life threatening conditions related to emergency care: CT renal stone study, which revealed no evidence of kidney stone or other acute finding to explain patient's symptoms.  I agree with radiologist interpretation.  Disposition:   Patient's workup today is overall reassuring.  She is not currently complaining of any right-sided flank or abdominal pain.  Physical exam is also reassuring and with benign findings.  Patient's only complaint at this time is nausea.  Will send prescription for Zofran to the pharmacy and recommended PCP follow-up as needed.  Patient may have had small kidney stone that she passed earlier today, but did not cause hydronephrosis or hydroureter, given that she is pain free currently.    The patient has been appropriately medically screened and/or stabilized in the ED. I have low suspicion for any other emergent medical condition which would require further screening, evaluation or  treatment in the ED or require inpatient management. At time of discharge the patient is hemodynamically stable and in no acute distress. I have discussed work-up results and diagnosis with patient and answered all questions. Patient is agreeable with discharge plan. We discussed strict return precautions for returning to the emergency department and they verbalized understanding.            Final Clinical Impression(s) / ED Diagnoses Final diagnoses:  Right flank pain  Nausea    Rx / DC Orders ED Discharge Orders          Ordered    ondansetron (ZOFRAN) 4 MG tablet  Every 6 hours        06/23/22 1603              Lenard Simmer, PA-C 06/23/22 1609    Zadie Rhine, MD 06/26/22 [id number]

## 2022-06-23 NOTE — Discharge Instructions (Signed)
Thank you for allowing me to be part of your care today.  Your workup today is overall very reassuring and does not show that you have a UTI or kidney stone.  I recommend continuing to increase your hydration and drink plenty of water throughout the day.  I have sent over a medication to help you with the nausea you are experiencing.  Please take this as prescribed.  I recommend scheduling a follow-up appointment with your primary care provider if your symptoms continue to persist.  Return to the ED if you develop sudden worsening of your symptoms or if you have any new concerns.

## 2022-06-23 NOTE — ED Triage Notes (Signed)
Patient here POV from Home.  Endorses Back Pain since 0300 today. Constant and Consistent. Some Frequency noted. Radiating to Right ABD from Right Flank.   800 mg of ibuprofen at 0800 without relief. Some nausea. No Emesis. No Diarrhea. No Fevers.  NAD Noted during Triage. A&Ox4. GCS 15. Ambulatory.

## 2022-07-10 ENCOUNTER — Telehealth: Payer: Medicaid Other | Admitting: Physician Assistant

## 2022-07-10 DIAGNOSIS — J019 Acute sinusitis, unspecified: Secondary | ICD-10-CM | POA: Diagnosis not present

## 2022-07-10 DIAGNOSIS — B9689 Other specified bacterial agents as the cause of diseases classified elsewhere: Secondary | ICD-10-CM

## 2022-07-10 MED ORDER — AMOXICILLIN-POT CLAVULANATE 875-125 MG PO TABS
1.0000 | ORAL_TABLET | Freq: Two times a day (BID) | ORAL | 0 refills | Status: AC
Start: 2022-07-10 — End: ?

## 2022-07-10 MED ORDER — FLUTICASONE PROPIONATE 50 MCG/ACT NA SUSP
2.0000 | Freq: Every day | NASAL | 0 refills | Status: AC
Start: 2022-07-10 — End: ?

## 2022-07-10 NOTE — Progress Notes (Signed)
Virtual Visit Consent   Leah [name], you are scheduled for a virtual visit with a [facility] provider today. Just as with appointments in the office, your consent must be obtained to participate. Your consent will be active for this visit and any virtual visit you may have with one of our providers in the next 365 days. If you have a MyChart account, a copy of this consent can be sent to you electronically.  As this is a virtual visit, video technology does not allow for your provider to perform a traditional examination. This may limit your provider's ability to fully assess your condition. If your provider identifies any concerns that need to be evaluated in person or the need to arrange testing (such as labs, EKG, etc.), we will make arrangements to do so. Although advances in technology are sophisticated, we cannot ensure that it will always work on either your end or our end. If the connection with a video visit is poor, the visit may have to be switched to a telephone visit. With either a video or telephone visit, we are not always able to ensure that we have a secure connection.  By engaging in this virtual visit, you consent to the provision of healthcare and authorize for your insurance to be billed (if applicable) for the services provided during this visit. Depending on your insurance coverage, you may receive a charge related to this service.  I need to obtain your verbal consent now. Are you willing to proceed with your visit today? BERTHINE KOERBER has provided verbal consent on 07/10/2022 for a virtual visit (video or telephone). Leah [name], [place]  Date: 07/10/2022 9:20 AM  Virtual Visit via Video Note   I, Leah [name], connected with  CAPRICIA GRAVELINE  ([id number], Jul 20, 2000) on 07/10/22 at  9:15 AM EDT by a video-enabled telemedicine application and verified that I am speaking with the correct person using two identifiers.  Location: Patient: Virtual Visit  Location Patient: Home Provider: Virtual Visit Location Provider: Home Office   I discussed the limitations of evaluation and management by telemedicine and the availability of in person appointments. The patient expressed understanding and agreed to proceed.    History of Present Illness: Leah [name] is a 22 y.o. who identifies as a female who was assigned female at birth, and is being seen today for possible sinusitis. Notes > 5 days of nasal congestion, sinus pressure and productive cough, now with thick phlegm. Some upset stomach from drainage. Denies fever, chills. Notes some sweats. Recently at the beach but no other travel. Notes now with sinus pain and bilateral ear pain.   OTC -- Mucinex, Theraflu.   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   Benign skin mole 02/18/2018   Alcohol use disorder, moderate, dependence (HCC) 10/07/2017   Adjustment disorder with mixed anxiety and depressed mood 10/07/2017    Allergies: No Known Allergies Medications:  Current Outpatient Medications:    amoxicillin-clavulanate (AUGMENTIN) 875-125 MG tablet, Take 1 tablet by mouth 2 (two) times daily., Disp: 14 tablet, Rfl: 0   fluticasone (FLONASE) 50 MCG/ACT nasal spray, Place 2 sprays into both nostrils daily., Disp: 16 g, Rfl: 0   valACYclovir (VALTREX) 500 MG tablet, Take by mouth., Disp: , Rfl:   Observations/Objective: Patient is well-developed, well-nourished in no acute distress.  Resting comfortably at home.  Head is normocephalic, atraumatic.  No labored breathing. Speech is clear and coherent with logical content.  Patient is alert and oriented at baseline.  Assessment and Plan: 1. Acute bacterial sinusitis - fluticasone (FLONASE) 50 MCG/ACT nasal spray; Place 2 sprays into both nostrils daily.  Dispense: 16 g; Refill: 0 -  amoxicillin-clavulanate (AUGMENTIN) 875-125 MG tablet; Take 1 tablet by mouth 2 (two) times daily.  Dispense: 14 tablet; Refill: 0  Rx Augmentin.  Increase fluids.  Rest.  Saline nasal spray.  Probiotic.  Mucinex as directed.  Humidifier in bedroom. Flonase per orders.  Call or return to clinic if symptoms are not improving.   Follow Up Instructions: I discussed the assessment and treatment plan with the patient. The patient was provided an opportunity to ask questions and all were answered. The patient agreed with the plan and demonstrated an understanding of the instructions.  A copy of instructions were sent to the patient via MyChart unless otherwise noted below.   The patient was advised to call back or seek an in-person evaluation if the symptoms worsen or if the condition fails to improve as anticipated.  Time:  I spent 10 minutes with the patient via telehealth technology discussing the above problems/concerns.    Leah Climes, PA-C

## 2022-07-10 NOTE — Patient Instructions (Signed)
Leah [name], thank you for joining [name], PA-C for today's virtual visit.  While this provider is not your primary care provider (PCP), if your PCP is located in our provider database this encounter information will be shared with them immediately following your visit.   A [facility] MyChart account gives you access to today's visit and all your visits, tests, and labs performed at [facility] " click here if you don't have a [facility] MyChart account or go to [email or web address]  Consent: (Patient) Leah [name] provided verbal consent for this virtual visit at the beginning of the encounter.  Current Medications:  Current Outpatient Medications:    metroNIDAZOLE (METROGEL) 0.75 % vaginal gel, Place 1 Applicatorful vaginally at bedtime., Disp: 70 g, Rfl: 0   ondansetron (ZOFRAN) 4 MG tablet, Take 1 tablet (4 mg total) by mouth every 6 (six) hours., Disp: 12 tablet, Rfl: 0   valACYclovir (VALTREX) 500 MG tablet, Take by mouth., Disp: , Rfl:    Medications ordered in this encounter:  No orders of the defined types were placed in this encounter.    *If you need refills on other medications prior to your next appointment, please contact your pharmacy*  Follow-Up: Call back or seek an in-person evaluation if the symptoms worsen or if the condition fails to improve as anticipated.  [facility] [phone]  Other Instructions Please take antibiotic as directed.  Increase fluid intake.  Use Saline nasal spray.  Take a daily multivitamin. Use the Flonase as directed. Ok to resume Mucinex.  Place a humidifier in the bedroom.  Please call or return clinic if symptoms are not improving.  Sinusitis Sinusitis is redness, soreness, and swelling (inflammation) of the paranasal sinuses. Paranasal sinuses are air pockets within the bones of your face (beneath the eyes, the middle of the forehead, or above the eyes). In healthy paranasal  sinuses, mucus is able to drain out, and air is able to circulate through them by way of your nose. However, when your paranasal sinuses are inflamed, mucus and air can become trapped. This can allow bacteria and other germs to grow and cause infection. Sinusitis can develop quickly and last only a short time (acute) or continue over a long period (chronic). Sinusitis that lasts for more than 12 weeks is considered chronic.  CAUSES  Causes of sinusitis include: Allergies. Structural abnormalities, such as displacement of the cartilage that separates your nostrils (deviated septum), which can decrease the air flow through your nose and sinuses and affect sinus drainage. Functional abnormalities, such as when the small hairs (cilia) that line your sinuses and help remove mucus do not work properly or are not present. SYMPTOMS  Symptoms of acute and chronic sinusitis are the same. The primary symptoms are pain and pressure around the affected sinuses. Other symptoms include: Upper toothache. Earache. Headache. Bad breath. Decreased sense of smell and taste. A cough, which worsens when you are lying flat. Fatigue. Fever. Thick drainage from your nose, which often is green and may contain pus (purulent). Swelling and warmth over the affected sinuses. DIAGNOSIS  Your caregiver will perform a physical exam. During the exam, your caregiver may: Look in your nose for signs of abnormal growths in your nostrils (nasal polyps). Tap over the affected sinus to check for signs of infection. View the inside of your sinuses (endoscopy) with a special imaging device with a light attached (endoscope), which is inserted into your sinuses. If your caregiver suspects that  you have chronic sinusitis, one or more of the following tests may be recommended: Allergy tests. Nasal culture A sample of mucus is taken from your nose and sent to a lab and screened for bacteria. Nasal cytology A sample of mucus is taken  from your nose and examined by your caregiver to determine if your sinusitis is related to an allergy. TREATMENT  Most cases of acute sinusitis are related to a viral infection and will resolve on their own within 10 days. Sometimes medicines are prescribed to help relieve symptoms (pain medicine, decongestants, nasal steroid sprays, or saline sprays).  However, for sinusitis related to a bacterial infection, your caregiver will prescribe antibiotic medicines. These are medicines that will help kill the bacteria causing the infection.  Rarely, sinusitis is caused by a fungal infection. In theses cases, your caregiver will prescribe antifungal medicine. For some cases of chronic sinusitis, surgery is needed. Generally, these are cases in which sinusitis recurs more than 3 times per year, despite other treatments. HOME CARE INSTRUCTIONS  Drink plenty of water. Water helps thin the mucus so your sinuses can drain more easily. Use a humidifier. Inhale steam 3 to 4 times a day (for example, sit in the bathroom with the shower running). Apply a warm, moist washcloth to your face 3 to 4 times a day, or as directed by your caregiver. Use saline nasal sprays to help moisten and clean your sinuses. Take over-the-counter or prescription medicines for pain, discomfort, or fever only as directed by your caregiver. SEEK IMMEDIATE MEDICAL CARE IF: You have increasing pain or severe headaches. You have nausea, vomiting, or drowsiness. You have swelling around your face. You have vision problems. You have a stiff neck. You have difficulty breathing. MAKE SURE YOU:  Understand these instructions. Will watch your condition. Will get help right away if you are not doing well or get worse. Document Released: 01/27/2005 Document Revised: 04/21/2011 Document Reviewed: 02/11/2011 [facility] Patient Information 2014 [place], [place].    If you have been instructed to have an in-person evaluation today at a local  Urgent Care facility, please use the link below. It will take you to a list of all of our available [facility], including address, phone number and hours of operation. Please do not delay care.  [facility]  If you or a family member do not have a primary care provider, use the link below to schedule a visit and establish care. When you choose a [facility] primary care physician or advanced practice provider, you gain a long-term partner in health. Find a Primary Care Provider  Learn more about [facility]'s in-office and virtual care options: [facility] - Get Care Now
